# Patient Record
Sex: Female | Born: 1950 | Race: White | Hispanic: No | Marital: Married | Smoking: Never smoker
Health system: Southern US, Community
[De-identification: ages and names within clinical notes are randomized; demographics above are authoritative.]

## PROBLEM LIST (undated history)

## (undated) DIAGNOSIS — E049 Nontoxic goiter, unspecified: Secondary | ICD-10-CM

## (undated) DIAGNOSIS — E7800 Pure hypercholesterolemia, unspecified: Secondary | ICD-10-CM

## (undated) DIAGNOSIS — K429 Umbilical hernia without obstruction or gangrene: Secondary | ICD-10-CM

## (undated) DIAGNOSIS — B019 Varicella without complication: Secondary | ICD-10-CM

## (undated) DIAGNOSIS — E039 Hypothyroidism, unspecified: Secondary | ICD-10-CM

## (undated) DIAGNOSIS — Z1211 Encounter for screening for malignant neoplasm of colon: Secondary | ICD-10-CM

## (undated) DIAGNOSIS — E0590 Thyrotoxicosis, unspecified without thyrotoxic crisis or storm: Secondary | ICD-10-CM

## (undated) DIAGNOSIS — M810 Age-related osteoporosis without current pathological fracture: Secondary | ICD-10-CM

## (undated) DIAGNOSIS — Z9289 Personal history of other medical treatment: Secondary | ICD-10-CM

## (undated) HISTORY — DX: Age-related osteoporosis without current pathological fracture: M81.0

## (undated) HISTORY — DX: Nontoxic goiter, unspecified: E04.9

## (undated) HISTORY — DX: Pure hypercholesterolemia, unspecified: E78.00

## (undated) HISTORY — DX: Varicella without complication: B01.9

## (undated) HISTORY — DX: Personal history of other medical treatment: Z92.89

## (undated) HISTORY — DX: Thyrotoxicosis, unspecified without thyrotoxic crisis or storm: E05.90

## (undated) HISTORY — PX: HERNIA REPAIR: SHX51

## (undated) HISTORY — PX: THYROID SURGERY: SHX805

## (undated) HISTORY — DX: Umbilical hernia without obstruction or gangrene: K42.9

## (undated) HISTORY — DX: Hypothyroidism, unspecified: E03.9

## (undated) HISTORY — PX: MYOMECTOMY: SHX85

---
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
## 1987-02-06 HISTORY — PX: TUBAL LIGATION: SHX77

## 2008-08-05 DIAGNOSIS — E049 Nontoxic goiter, unspecified: Secondary | ICD-10-CM

## 2008-08-05 HISTORY — DX: Nontoxic goiter, unspecified: E04.9

## 2009-09-28 DIAGNOSIS — Z9289 Personal history of other medical treatment: Secondary | ICD-10-CM

## 2009-09-28 HISTORY — DX: Personal history of other medical treatment: Z92.89

## 2009-12-19 DIAGNOSIS — Z9289 Personal history of other medical treatment: Secondary | ICD-10-CM

## 2009-12-19 HISTORY — DX: Personal history of other medical treatment: Z92.89

## 2009-12-19 LAB — HM DEXA SCAN

## 2014-12-23 DIAGNOSIS — Z9289 Personal history of other medical treatment: Secondary | ICD-10-CM

## 2014-12-23 HISTORY — DX: Personal history of other medical treatment: Z92.89

## 2014-12-24 LAB — HM PAP SMEAR: HM Pap smear: NEGATIVE

## 2015-06-10 LAB — HM MAMMOGRAPHY

## 2016-04-04 ENCOUNTER — Other Ambulatory Visit: Payer: Self-pay | Admitting: Obstetrics and Gynecology

## 2016-04-04 DIAGNOSIS — Q501 Developmental ovarian cyst: Secondary | ICD-10-CM

## 2016-04-10 ENCOUNTER — Encounter: Payer: Self-pay | Admitting: Obstetrics and Gynecology

## 2016-04-10 ENCOUNTER — Ambulatory Visit (INDEPENDENT_AMBULATORY_CARE_PROVIDER_SITE_OTHER): Payer: BLUE CROSS/BLUE SHIELD | Admitting: Obstetrics and Gynecology

## 2016-04-10 ENCOUNTER — Ambulatory Visit (INDEPENDENT_AMBULATORY_CARE_PROVIDER_SITE_OTHER): Payer: BLUE CROSS/BLUE SHIELD

## 2016-04-10 VITALS — BP 132/68 | Ht 64.5 in | Wt 183.0 lb

## 2016-04-10 DIAGNOSIS — Q501 Developmental ovarian cyst: Secondary | ICD-10-CM

## 2016-04-10 DIAGNOSIS — Z01419 Encounter for gynecological examination (general) (routine) without abnormal findings: Secondary | ICD-10-CM | POA: Diagnosis not present

## 2016-04-10 DIAGNOSIS — Z1239 Encounter for other screening for malignant neoplasm of breast: Secondary | ICD-10-CM

## 2016-04-10 DIAGNOSIS — N839 Noninflammatory disorder of ovary, fallopian tube and broad ligament, unspecified: Secondary | ICD-10-CM | POA: Diagnosis not present

## 2016-04-10 DIAGNOSIS — N838 Other noninflammatory disorders of ovary, fallopian tube and broad ligament: Secondary | ICD-10-CM

## 2016-04-10 DIAGNOSIS — Z1211 Encounter for screening for malignant neoplasm of colon: Secondary | ICD-10-CM | POA: Diagnosis not present

## 2016-04-10 DIAGNOSIS — Z79890 Hormone replacement therapy (postmenopausal): Secondary | ICD-10-CM

## 2016-04-10 DIAGNOSIS — Z1231 Encounter for screening mammogram for malignant neoplasm of breast: Secondary | ICD-10-CM

## 2016-04-10 DIAGNOSIS — N951 Menopausal and female climacteric states: Secondary | ICD-10-CM

## 2016-04-10 LAB — HEMOCCULT GUIAC POC 1CARD (OFFICE): FECAL OCCULT BLD: NEGATIVE

## 2016-04-10 MED ORDER — PROGESTERONE MICRONIZED 100 MG PO CAPS: 100.0000 mg | ORAL_CAPSULE | Freq: Every day | ORAL | 3 refills | Status: DC

## 2016-04-10 MED ORDER — AMBULATORY NON FORMULARY MEDICATION: 1.0000 mL | Freq: Every day | 3 refills | Status: DC

## 2016-04-10 NOTE — Progress Notes (Signed)
HPI:      [name] is a 66 y.o. [phone] who LMP was No LMP recorded. Patient is postmenopausal., presents today for her annual examination.  Her menses are absent. She has vasomotor sx and treats with biest 5 mg 70/30 and progesterone 100 mg caps nightly. She was started on this by Carroll Kinds in the past who has since moved away. Her sx are mostly controlled. No side effects. No Vb.   She has a single partner, uses lubricants for vag dryness. Last Pap: 12/14/14 Results were: no abnormalities  /neg HPV DNA  Last mammogram: 06/10/15. Results were: normal--routine follow-up in 12 months There is no FH of breast or ovarian cancer. The patient does not do self-breast exams.  Colonoscopy: never, will consider  Tobacco use: The patient denies current or previous tobacco use. Alcohol use: none Exercise: moderately active  She does  get adequate calcium and Vitamin D in her diet.  She also had a f/u u/s today from 7/15 u/s which showed 3 cm RTO cyst. She had not done prior u/s f/u. She denies any RLQ pain, PMB sx. She has a hx of ovar cysts in her 58s.  Past Medical History:  Diagnosis Date  . 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 History of bone density study 12/19/2009   BIBC  . History of mammogram 09/28/2009   BIBC BIRAD 2; BIRADS 0 ON 05/12/15 RT BR MASS; COMP VIEWS AND U/S - SM CYST AT 9:00.  . History of Papanicolaou smear of cervix 12/23/2014   -/-  . Hypercholesteremia   . Hyperthyroidism   . Hypothyroidism (acquired)    AFTER THYROID ABLATION  . Osteoporosis    OF THE SPINE    Past Surgical History:  Procedure Laterality Date  . [place]  . MYOMECTOMY  1978-79   FIBROID REMOVED  . THYROID SURGERY     ABLATION  . TUBAL LIGATION  1989    Family History  Problem Relation Age of Onset  . Hypertension Mother   . Neurologic Disorder Mother   . Ulcerative colitis Mother   . Hypertension Father   . Heart attack Father   . Lymphoma Maternal Aunt    . Diabetes Maternal Grandmother   . Cancer - Cervical Paternal Grandmother   . Heart attack Paternal Grandfather      ROS:  Review of Systems  Constitutional: Negative for fever, malaise/fatigue and weight loss.  HENT: Negative for congestion, ear pain and sinus pain.   Respiratory: Negative for cough, shortness of breath and wheezing.   Cardiovascular: Negative for chest pain, orthopnea and leg swelling.  Gastrointestinal: Negative for constipation, diarrhea, nausea and vomiting.  Genitourinary: Negative for dysuria, frequency, hematuria and urgency.       Breast ROS: negative    Musculoskeletal: Negative for back pain, joint pain and myalgias.  Skin: Negative for itching and rash.  Neurological: Negative for dizziness, tingling, focal weakness and headaches.  Endo/Heme/Allergies: Negative for environmental allergies. Does not bruise/bleed easily.  Psychiatric/Behavioral: Negative for depression and suicidal ideas. The patient is not nervous/anxious and does not have insomnia.     Objective: BP 132/68   Ht 5' 4.5" (1.638 m)   Wt 183 lb (83 kg)   BMI 30.93 kg/m    Physical Exam  Constitutional: She is oriented to person, place, and time. She appears well-developed and well-nourished.  Genitourinary: Vagina normal and uterus normal. No erythema or tenderness in the vagina. No vaginal  discharge found. Right adnexum does not display mass and does not display tenderness. Left adnexum does not display mass and does not display tenderness. Cervix does not exhibit motion tenderness or polyp. Uterus is not enlarged or tender. Rectal exam shows guaiac negative stool.  Neck: Normal range of motion. No thyromegaly present.  Cardiovascular: Normal rate, regular rhythm and normal heart sounds.   No murmur heard. Pulmonary/Chest: Effort normal and breath sounds normal. Right breast exhibits no mass, no nipple discharge, no skin change and no tenderness. Left breast exhibits no mass, no  nipple discharge, no skin change and no tenderness.  Abdominal: Soft. There is no tenderness. There is no guarding.  Musculoskeletal: Normal range of motion.  Neurological: She is alert and oriented to person, place, and time. No cranial nerve deficit.  Psychiatric: She has a normal mood and affect. Her behavior is normal.  Vitals reviewed.   Results: GYN u/s:EM=85mm; no FF in CDS; LO not visualized due to bowel; RTO with either hydrosalpinx vs 6 cm RTO cyst (pt had poss 4 cm RTO cyst 7/15 u/s)  Hemoccult NEG   Assessment: Encounter for annual routine gynecological examination  Screening for colon cancer - Neg Hemoccult. Pt to consider GI ref for scr colonoscopy due to age. - Plan: POCT Occult Blood Stool, MM DIGITAL SCREENING BILATERAL  Vasomotor symptoms due to menopause - Plan: progesterone (PROMETRIUM) 100 MG capsule, AMBULATORY NON FORMULARY MEDICATION  Hormone replacement therapy - Rx RF Biest and progesterone eRxd. Will re-eval need for meds next yr.  Ovarian mass, right - Larger from 2015. Pt without sx. Rechk in 2-3 months. Will f/u with results/mgmt.  - Plan: US Pelvis Complete, US Pelvis Complete  Screening for breast cancer     Plan:            GYN counsel breast self exam, use and side effects of HRT     F/U  Return in about 3 months (around 07/11/2016) for GYN u/s RTO process.  Alicia B. Copland, PA-C 04/10/2016 9:10 PM

## 2016-04-12 LAB — HPV APTIMA: HPV Aptima: NEGATIVE

## 2016-04-23 ENCOUNTER — Telehealth: Payer: Self-pay | Admitting: Obstetrics and Gynecology

## 2016-04-23 NOTE — Telephone Encounter (Signed)
PT is calling today needing an refill on estrogen an 90 Day supply. Pt reports she was seen on 04/12/16. Pt uses [facility]. NI#[phone]

## 2016-04-23 NOTE — Telephone Encounter (Signed)
Tried to call pt to let her know rx for biest called in to [facility] but voicemail not set up. [facility].

## 2016-04-23 NOTE — Telephone Encounter (Signed)
Rx faxed at 3/18 annual. RN, pls call [facility] pharm with Rx RF info and notify pt that it was sent at annual and called in today.

## 2016-05-11 ENCOUNTER — Ambulatory Visit (INDEPENDENT_AMBULATORY_CARE_PROVIDER_SITE_OTHER): Payer: BLUE CROSS/BLUE SHIELD | Admitting: Primary Care

## 2016-05-11 ENCOUNTER — Encounter: Payer: Self-pay | Admitting: Primary Care

## 2016-05-11 ENCOUNTER — Encounter (INDEPENDENT_AMBULATORY_CARE_PROVIDER_SITE_OTHER): Payer: Self-pay

## 2016-05-11 VITALS — BP 136/84 | HR 90 | Temp 98.1°F | Ht 63.25 in | Wt 180.1 lb

## 2016-05-11 DIAGNOSIS — R232 Flushing: Secondary | ICD-10-CM | POA: Diagnosis not present

## 2016-05-11 DIAGNOSIS — R030 Elevated blood-pressure reading, without diagnosis of hypertension: Secondary | ICD-10-CM | POA: Diagnosis not present

## 2016-05-11 DIAGNOSIS — E039 Hypothyroidism, unspecified: Secondary | ICD-10-CM | POA: Diagnosis not present

## 2016-05-11 NOTE — Assessment & Plan Note (Signed)
Stable based off of labs she brought into the clinic, will scan into chart. Discussed that I will manage as long as she is stable, but will need to see specialist if she becomes unstable.

## 2016-05-11 NOTE — Assessment & Plan Note (Signed)
Stable, following with GYN.  Managed on compounded estrogen.

## 2016-05-11 NOTE — Progress Notes (Signed)
Subjective:    Patient ID: Kathryn Fields, female    DOB: Jul 07, 1950, 66 y.o.   MRN: [id number]  HPI  Kathryn Fields is a 66 year old female who presents today to establish care and discuss the problems mentioned below. Will obtain old records.  1) Hot Flashes/Osteoporosis: Currently managed on compounded estrogen and Prometrium capsules. Also managed on Vitamin D 5000 units. She is currently following with GYN annually. Overall she feels well managed.   2) Hypothyroidism: Initially with hyperthyroidism, underwent radioactive iodine several years ago and has been hypothyroid since. Currently managed on Armour Thyroid 90 mg and liothyronine 20 mcg (compounded). Her last levels were stable in July 2017. She does occasionally feel jittery but will reduce the dose of her Armour Thyroid with improvement in symptoms.  3) Tinnitus: Located to the left ear that has been present intermittently for years. Her tinnitus has overall improved with vitamin B 12.   4) Elevated Blood Pressure: Initial BP in the office today of 146/90,. She checks her BP at home and gets readings of 130's/80's on average. She has family history of hypertension in both parents. She denies chest pain, shortness of breath, dizziness.   Review of Systems  Constitutional: Negative for fatigue.  HENT: Positive for tinnitus.   Eyes: Negative for visual disturbance.  Respiratory: Negative for shortness of breath.   Cardiovascular: Negative for chest pain and palpitations.  Neurological: Negative for dizziness and headaches.       Past Medical History:  Diagnosis Date  . Chickenpox   . 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 History of bone density study 12/19/2009   BIBC  . History of mammogram 09/28/2009   BIBC BIRAD 2; BIRADS 0 ON 05/12/15 RT BR MASS; COMP VIEWS AND U/S - SM CYST AT 9:00.  . History of Papanicolaou smear of cervix 12/23/2014   -/-  . Hypercholesteremia   . Hyperthyroidism   . Hypothyroidism (acquired)    AFTER THYROID ABLATION  . Osteoporosis    OF THE SPINE  . Periumbilical hernia      Social History   Social History  . Marital status: Married    Spouse name: N/A  . Number of children: N/A  . Years of education: 20   Occupational History  . RETIRED    Social History Main Topics  . Smoking status: Never Smoker  . Smokeless tobacco: Never Used  . Alcohol use No  . Drug use: No  . Sexual activity: Yes    Birth control/ protection: Post-menopausal   Other Topics Concern  . Not on file   Social History Narrative   Married.   3 children.   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   Enjoys bowling, traveling, reading.     Past Surgical History:  Procedure Laterality Date  . [place]  . MYOMECTOMY  1978-79   FIBROID REMOVED  . THYROID SURGERY     ABLATION  . TUBAL LIGATION  1989    Family History  Problem Relation Age of Onset  . Hypertension Mother   . Neurologic Disorder Mother   . Ulcerative colitis Mother   . Hypertension Father   . Heart attack Father   . Lymphoma Maternal Aunt   . Diabetes Maternal Grandmother   . Cancer - Cervical Paternal Grandmother   . Heart attack Paternal Grandfather     Allergies  Allergen Reactions  . Iodinated Diagnostic Agents     Iv dye,iodine containing contrast  media    Current Outpatient Prescriptions on File Prior to Visit  Medication Sig Dispense Refill  . AMBULATORY NON FORMULARY MEDICATION Take 12.5 mg by mouth daily   Lodorol - Iodine/Potassium Supplement    . AMBULATORY NON FORMULARY MEDICATION Place 1 mL onto the skin daily. Biest 5 mg 70/30 30 mL 3  . Cholecalciferol (VITAMIN D3) 5000 units TABS Take 5,000 Units by mouth daily.    . Menaquinone-7 (VITAMIN K2) 40 MCG TABS Take by mouth.    . progesterone (PROMETRIUM) 100 MG capsule Take 1 capsule (100 mg total) by mouth daily. 90 capsule 3  . thyroid (ARMOUR) 90 MG tablet Take 90 mg by mouth daily.    . vitamin B-12 (CYANOCOBALAMIN) 1000 MCG  tablet Take 1,000 mcg by mouth daily.    . vitamin E 400 UNIT capsule Take 400 Units by mouth daily.     No current facility-administered medications on file prior to visit.     BP 136/84   Pulse 90   Temp 98.1 F (36.7 C) (Oral)   Ht 5' 3.25" (1.607 m)   Wt 180 lb 1.9 oz (81.7 kg)   SpO2 98%   BMI 31.66 kg/m    Objective:   Physical Exam  Constitutional: She appears well-nourished.  Neck: Neck supple. No thyromegaly present.  Cardiovascular: Normal rate and regular rhythm.   Pulmonary/Chest: Effort normal and breath sounds normal.  Skin: Skin is warm and dry.  Psychiatric: She has a normal mood and affect.          Assessment & Plan:

## 2016-05-11 NOTE — Progress Notes (Signed)
Pre visit review using our clinic review tool, if applicable. No additional management support is needed unless otherwise documented below in the visit note. 

## 2016-05-11 NOTE — Assessment & Plan Note (Signed)
Slightly above goal upon initial check, improved with recheck. Will continue to monitor.

## 2016-05-11 NOTE — Patient Instructions (Signed)
Schedule a lab only appointment up front to recheck your thyroid function.  It was a pleasure to meet you today! Please don't hesitate to call me with any questions. Welcome to [facility]!

## 2016-05-15 ENCOUNTER — Encounter (INDEPENDENT_AMBULATORY_CARE_PROVIDER_SITE_OTHER): Payer: Self-pay

## 2016-05-15 ENCOUNTER — Other Ambulatory Visit (INDEPENDENT_AMBULATORY_CARE_PROVIDER_SITE_OTHER): Payer: BLUE CROSS/BLUE SHIELD

## 2016-05-15 DIAGNOSIS — E039 Hypothyroidism, unspecified: Secondary | ICD-10-CM | POA: Diagnosis not present

## 2016-05-15 LAB — T3, FREE: T3, Free: 4 pg/mL (ref 2.3–4.2)

## 2016-05-15 LAB — T4, FREE: Free T4: 1.2 ng/dL (ref 0.8–1.8)

## 2016-05-15 LAB — TSH: TSH: 0.01 m[IU]/L — AB

## 2016-05-17 LAB — T3, REVERSE: T3 REVERSE: 14 ng/dL (ref 8–25)

## 2016-07-12 ENCOUNTER — Telehealth: Payer: Self-pay | Admitting: Obstetrics and Gynecology

## 2016-07-12 ENCOUNTER — Ambulatory Visit (INDEPENDENT_AMBULATORY_CARE_PROVIDER_SITE_OTHER): Payer: BLUE CROSS/BLUE SHIELD

## 2016-07-12 DIAGNOSIS — N838 Other noninflammatory disorders of ovary, fallopian tube and broad ligament: Secondary | ICD-10-CM

## 2016-07-12 DIAGNOSIS — N839 Noninflammatory disorder of ovary, fallopian tube and broad ligament, unspecified: Secondary | ICD-10-CM

## 2016-07-12 DIAGNOSIS — N7011 Chronic salpingitis: Secondary | ICD-10-CM

## 2016-07-12 NOTE — Telephone Encounter (Signed)
LM for pt with u/s results. RT hydrosalpinx is 5 cm, down from 6 cm. Message sent to [facility] for dispo. Will f/u with pt after discussing with him.

## 2016-07-16 ENCOUNTER — Other Ambulatory Visit: Payer: Self-pay | Admitting: Obstetrics and Gynecology

## 2016-07-16 DIAGNOSIS — N7011 Chronic salpingitis: Secondary | ICD-10-CM

## 2016-07-16 NOTE — Telephone Encounter (Signed)
See orders note.

## 2016-07-16 NOTE — Progress Notes (Signed)
Review of ULTRASOUND.    I have personally reviewed images and report of recent ultrasound done at [facility].    Plan of management to be discussed with patient.  Barnett Applebaum, MD, [facility], [facility] 07/16/2016  11:28 AM

## 2016-07-16 NOTE — Telephone Encounter (Signed)
Pt aware of u/s results. Will check ca-125 due to hydrosalpinx. If neg, will cont to follow with u/s. I suggested 8-12 wk f/u, pt not sure why she needs repeat u/s since she is not having sx. Decided to check labs first and then discuss her timeline preference for u/s f/u.

## 2016-07-16 NOTE — Telephone Encounter (Signed)
Pt calling to remind [facility] to call c u/s results.  [phone]

## 2016-10-16 ENCOUNTER — Other Ambulatory Visit: Payer: Self-pay

## 2016-10-16 DIAGNOSIS — E039 Hypothyroidism, unspecified: Secondary | ICD-10-CM

## 2016-10-16 MED ORDER — THYROID 90 MG PO TABS: 90.0000 mg | ORAL_TABLET | Freq: Every day | ORAL | 1 refills | Status: DC

## 2016-10-16 NOTE — Telephone Encounter (Signed)
Please call in liothyronine 20 mcg to [facility]. Take 1 tablet by mouth daily. #90, 1 refill

## 2016-10-16 NOTE — Telephone Encounter (Signed)
Pt left v/m requesting refill for armour thyroid [facility] and [facility] T 3   20 mcg to [facility]. Pt request cb when done. Pt established care 05/11/16 and labs done 05/15/16. Allie Bossier NP has not prescribed before.Please advise.

## 2016-10-16 NOTE — Telephone Encounter (Signed)
Called in medication to the pharmacy as instructed. 

## 2016-10-16 NOTE — Telephone Encounter (Signed)
Notified patient that Rx has been called in to [facility]

## 2017-03-01 LAB — CA 125: CANCER ANTIGEN (CA) 125: 8.1 U/mL (ref 0.0–38.1)

## 2017-03-04 ENCOUNTER — Other Ambulatory Visit: Payer: Self-pay | Admitting: Obstetrics and Gynecology

## 2017-03-04 ENCOUNTER — Telehealth: Payer: Self-pay | Admitting: Obstetrics and Gynecology

## 2017-03-04 DIAGNOSIS — N951 Menopausal and female climacteric states: Secondary | ICD-10-CM

## 2017-03-04 MED ORDER — PROGESTERONE MICRONIZED 100 MG PO CAPS: 100.0000 mg | ORAL_CAPSULE | Freq: Every day | ORAL | 0 refills | Status: DC

## 2017-03-04 MED ORDER — BIEST/PROGESTERONE TD CREA: 1.0000 mL | TOPICAL_CREAM | Freq: Every day | TRANSDERMAL | 1 refills | Status: DC

## 2017-03-04 NOTE — Telephone Encounter (Signed)
Pt states progesterone needs to go to [facility].  Their # is [phone].

## 2017-03-04 NOTE — Telephone Encounter (Signed)
Pt aware of normal ca-125. Had hydrosalpinx 6/18 and was due for labs then. Pt just had done. Was suggested to f/u with u/s in 8-12 wks at that time, but pt was asymptomatic and wanted to wait. Has f/u u/s and annual sched for 04/11/17. Will f/u with mgmt then. Rx RF ERT and prog till 3/19 annual.

## 2017-04-04 ENCOUNTER — Other Ambulatory Visit: Payer: Self-pay | Admitting: Primary Care

## 2017-04-04 DIAGNOSIS — E039 Hypothyroidism, unspecified: Secondary | ICD-10-CM

## 2017-04-11 ENCOUNTER — Ambulatory Visit (INDEPENDENT_AMBULATORY_CARE_PROVIDER_SITE_OTHER): Payer: Medicare Other | Admitting: Obstetrics and Gynecology

## 2017-04-11 ENCOUNTER — Ambulatory Visit (INDEPENDENT_AMBULATORY_CARE_PROVIDER_SITE_OTHER): Payer: Medicare Other

## 2017-04-11 ENCOUNTER — Encounter: Payer: Self-pay | Admitting: Obstetrics and Gynecology

## 2017-04-11 VITALS — BP 140/80 | HR 92 | Ht 64.0 in | Wt 177.0 lb

## 2017-04-11 DIAGNOSIS — Z01419 Encounter for gynecological examination (general) (routine) without abnormal findings: Secondary | ICD-10-CM

## 2017-04-11 DIAGNOSIS — Z1231 Encounter for screening mammogram for malignant neoplasm of breast: Secondary | ICD-10-CM | POA: Diagnosis not present

## 2017-04-11 DIAGNOSIS — Z79890 Hormone replacement therapy (postmenopausal): Secondary | ICD-10-CM | POA: Diagnosis not present

## 2017-04-11 DIAGNOSIS — N7011 Chronic salpingitis: Secondary | ICD-10-CM | POA: Diagnosis not present

## 2017-04-11 DIAGNOSIS — Z13820 Encounter for screening for osteoporosis: Secondary | ICD-10-CM | POA: Diagnosis not present

## 2017-04-11 DIAGNOSIS — Z1211 Encounter for screening for malignant neoplasm of colon: Secondary | ICD-10-CM | POA: Diagnosis not present

## 2017-04-11 DIAGNOSIS — N951 Menopausal and female climacteric states: Secondary | ICD-10-CM | POA: Diagnosis not present

## 2017-04-11 DIAGNOSIS — N839 Noninflammatory disorder of ovary, fallopian tube and broad ligament, unspecified: Secondary | ICD-10-CM

## 2017-04-11 DIAGNOSIS — Z1239 Encounter for other screening for malignant neoplasm of breast: Secondary | ICD-10-CM

## 2017-04-11 LAB — HEMOCCULT GUIAC POC 1CARD (OFFICE): FECAL OCCULT BLD: NEGATIVE

## 2017-04-11 MED ORDER — PROGESTERONE MICRONIZED 100 MG PO CAPS: 100.0000 mg | ORAL_CAPSULE | Freq: Every day | ORAL | 3 refills | Status: DC

## 2017-04-11 MED ORDER — AMBULATORY NON FORMULARY MEDICATION
12 refills | Status: DC
Start: 2017-04-11 — End: 2018-08-20

## 2017-04-11 NOTE — Patient Instructions (Signed)
I value your feedback and entrusting us with your care. If you get a [facility] patient survey, I would appreciate you taking the time to let us know about your experience today. Thank you! 

## 2017-04-11 NOTE — Progress Notes (Signed)
PCP: Pleas Koch, NP   Chief Complaint  Patient presents with  . Gynecologic Exam    HPI:      Kathryn Fields is a 67 y.o. [phone] who LMP was No LMP recorded. Patient is postmenopausal., presents today for her annual examination.  Her menses are absent due to menopause. She does not have intermenstrual bleeding.  She does have vasomotor sx and treats with biest 5 mg 70/30 and progesterone 100 mg caps nightly. She was started on this by Carroll Kinds in the past who has since moved away. Her sx are mostly controlled. No side effects.   Sex activity: single partner, contraception - post menopausal status. She does have vaginal dryness, uses lubricants with relief.  Last Pap: December 14, 2014  Results were: no abnormalities /neg HPV DNA.  Hx of STDs: none  Last mammogram: Jun 10, 2015  Results were: normal--routine follow-up in 12 months There is no FH of breast cancer. There is no FH of ovarian cancer. The patient does not do self-breast exams.  Colonoscopy: never; considering it.  Tobacco use: The patient denies current or previous tobacco use. Alcohol use: none Exercise: moderately active  She does get adequate calcium and Vitamin D in her diet.  Labs with PCP.  She is being followed for RT hydrosalpinx noted on u/s last yr. She had neg ca-125 1/19. No pain/sx. F/u u/s done today.   Past Medical History:  Diagnosis Date  . Chickenpox   . 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 History of bone density study 12/19/2009   BIBC  . History of mammogram 09/28/2009   BIBC BIRAD 2; BIRADS 0 ON 05/12/15 RT BR MASS; COMP VIEWS AND U/S - SM CYST AT 9:00.  . History of Papanicolaou smear of cervix 12/23/2014   -/-  . Hypercholesteremia   . Hyperthyroidism   . Hypothyroidism (acquired)    AFTER THYROID ABLATION  . Osteoporosis    OF THE SPINE  . Periumbilical hernia     Past Surgical History:  Procedure Laterality Date  . [place]  . MYOMECTOMY   1978-79   FIBROID REMOVED  . THYROID SURGERY     ABLATION  . TUBAL LIGATION  1989    Family History  Problem Relation Age of Onset  . Hypertension Mother   . Neurologic Disorder Mother   . Ulcerative colitis Mother   . Hypertension Father   . Heart attack Father   . Lymphoma Maternal Aunt   . Diabetes Maternal Grandmother   . Cancer - Cervical Paternal Grandmother   . Heart attack Paternal Grandfather   . Uterine cancer Maternal Aunt        ?    Social History   Socioeconomic History  . Marital status: Married    Spouse name: Not on file  . Number of children: Not on file  . Years of education: 49  . Highest education level: Not on file  Social Needs  . Financial resource strain: Not on file  . Food insecurity - worry: Not on file  . Food insecurity - inability: Not on file  . Transportation needs - medical: Not on file  . Transportation needs - non-medical: Not on file  Occupational History  . Occupation: RETIRED  Tobacco Use  . Smoking status: Never Smoker  . Smokeless tobacco: Never Used  Substance and Sexual Activity  . Alcohol use: No  . Drug use: No  . Sexual activity:  Yes    Birth control/protection: Post-menopausal  Other Topics Concern  . Not on file  Social History Narrative   Married.   3 children.   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   Enjoys bowling, traveling, reading.     Current Meds  Medication Sig  . AMBULATORY NON FORMULARY MEDICATION Take 12.5 mg by mouth daily   Lodorol - Iodine/Potassium Supplement  . AMBULATORY NON FORMULARY MEDICATION Biest 5 mg 70/30; place 1 ml on skin daily  . Cholecalciferol (VITAMIN D3) 5000 units TABS Take 5,000 Units by mouth daily.  . Estradiol-Estriol-Progesterone (BIEST/PROGESTERONE) CREA Apply 1 mL topically daily. Use 1 ml Tropically Daily As Directed  BIEST 5 mg 70/30 VANICRM  . LIOTHYRONINE SODIUM PO Take 20 mcg by mouth daily.  Marland Kitchen MAGNESIUM CHLORIDE PO Take 800 mg by mouth as needed.  .  Menaquinone-7 (VITAMIN K2) 40 MCG TABS Take by mouth.  . progesterone (PROMETRIUM) 100 MG capsule Take 1 capsule (100 mg total) by mouth daily.  Marland Kitchen thyroid (ARMOUR THYROID) 90 MG tablet Take 1 tablet (90 mg total) by mouth daily. NEED APPOINTMENT FOR ANY MORE REFILLS  . vitamin B-12 (CYANOCOBALAMIN) 1000 MCG tablet Take 1,000 mcg by mouth daily.  . vitamin E 400 UNIT capsule Take 400 Units by mouth daily.  . [DISCONTINUED] AMBULATORY NON FORMULARY MEDICATION Place 1 mL onto the skin daily. Biest 5 mg 70/30  . [DISCONTINUED] progesterone (PROMETRIUM) 100 MG capsule Take 1 capsule (100 mg total) by mouth daily.  . [DISCONTINUED] progesterone (PROMETRIUM) 100 MG capsule TAKE 1 CAPSULE DAILY      ROS:  Review of Systems  Constitutional: Negative for fatigue, fever and unexpected weight change.  Respiratory: Negative for cough, shortness of breath and wheezing.   Cardiovascular: Negative for chest pain, palpitations and leg swelling.  Gastrointestinal: Negative for blood in stool, constipation, diarrhea, nausea and vomiting.  Endocrine: Negative for cold intolerance, heat intolerance and polyuria.  Genitourinary: Negative for dyspareunia, dysuria, flank pain, frequency, genital sores, hematuria, menstrual problem, pelvic pain, urgency, vaginal bleeding, vaginal discharge and vaginal pain.  Musculoskeletal: Negative for back pain, joint swelling and myalgias.  Skin: Negative for rash.  Neurological: Negative for dizziness, syncope, light-headedness, numbness and headaches.  Hematological: Negative for adenopathy.  Psychiatric/Behavioral: Negative for agitation, confusion, sleep disturbance and suicidal ideas. The patient is not nervous/anxious.      Objective: BP 140/80   Pulse 92   Ht 5\' 4"  (1.626 m)   Wt 177 lb (80.3 kg)   BMI 30.38 kg/m    Physical Exam  Constitutional: She is oriented to person, place, and time. She appears well-developed and well-nourished.  Genitourinary:  Rectum normal, vagina normal and uterus normal. There is no rash or tenderness on the right labia. There is no rash or tenderness on the left labia. No erythema or tenderness in the vagina. No vaginal discharge found. Right adnexum does not display mass and does not display tenderness. Left adnexum does not display mass and does not display tenderness. Cervix does not exhibit motion tenderness or polyp. Uterus is not enlarged or tender. Rectal exam shows no fissure and guaiac negative stool.  Neck: Normal range of motion. No thyromegaly present.  Cardiovascular: Normal rate, regular rhythm and normal heart sounds.  No murmur heard. Pulmonary/Chest: Effort normal and breath sounds normal. Right breast exhibits no mass, no nipple discharge, no skin change and no tenderness. Left breast exhibits no mass, no nipple discharge, no skin change and no tenderness.  Abdominal: Soft.  There is no tenderness. There is no guarding.  Musculoskeletal: Normal range of motion.  Neurological: She is alert and oriented to person, place, and time. No cranial nerve deficit.  Psychiatric: She has a normal mood and affect. Her behavior is normal.  Vitals reviewed.   Results: No results found for this or any previous visit (from the past 24 hour(s)).  ULTRASOUND REPORT  Location: [facility]  Date of Service: 04/11/2017    Indications:ov mass Findings:  The uterus is anteverted and measures 5.51 x 3.36 x 4.10cm. Echo texture is homogenous without evidence of focal masses.  The Endometrium measures 4.38 mm.  Right Ovary is not identified, but the fallopian tube remains dilated. Left Ovary  is not identified. Survey of the adnexa demonstrates no adnexal masses. There is no free fluid in the cul de sac.  Impression: 1. Dilate right fallopian tube, otherwise normal. 3.5 x 3.7 x 5.48 cm (was 4.9 x 5.3 cm 6/18)  Recommendations: 1.Clinical correlation with the patient's History and Physical  Exam.   Edwena Bunde, RDMS, RVT   Assessment/Plan:  Encounter for annual routine gynecological examination  Screening for breast cancer - Pt to have at [facility] with DEXA - Plan: MM DIGITAL SCREENING BILATERAL  Screening for colon cancer - Neg FOBT. Recommended colonoscopy. Pt to consider. Also can do cologard prn. Pt to f/u prn. - Plan: POCT Occult Blood Stool  Screening for osteoporosis - DEXA due at [facility]. Cont ca/Vit D/exercise. - Plan: DG Bone Density  Hydrosalpinx - RT, stable. Will discuss disposition with Dr. Kenton Kingfisher and f/u wiht pt. No pain/sx. Neg ca-125 1/19  Vasomotor symptoms due to menopause - Improved with HRT. Rx RF Biest and prog. Re-eval next yr. - Plan: progesterone (PROMETRIUM) 100 MG capsule, AMBULATORY NON FORMULARY MEDICATION  Hormone replacement therapy (HRT) - Plan: progesterone (PROMETRIUM) 100 MG capsule, AMBULATORY NON FORMULARY MEDICATION   Meds ordered this encounter  Medications  . progesterone (PROMETRIUM) 100 MG capsule    Sig: Take 1 capsule (100 mg total) by mouth daily.    Dispense:  90 capsule    Refill:  3    Order Specific Question:   Supervising Provider    Answer:   Gae Dry [id number]  . AMBULATORY NON FORMULARY MEDICATION    Sig: Biest 5 mg 70/30; place 1 ml on skin daily    Dispense:  30 mL    Refill:  12    Dispense 3-30 ml tubes    Order Specific Question:   Supervising Provider    Answer:   Gae Dry [[id number]]    GYN counsel breast self exam, mammography screening, menopause, osteoporosis, adequate intake of calcium and vitamin D, diet and exercise    F/U  Return in about 1 year (around 04/12/2018).  Alicia B. Copland, PA-C 04/15/2017 5:13 PM

## 2017-04-15 ENCOUNTER — Telehealth: Payer: Self-pay | Admitting: Obstetrics and Gynecology

## 2017-04-15 DIAGNOSIS — N7011 Chronic salpingitis: Secondary | ICD-10-CM

## 2017-04-15 NOTE — Telephone Encounter (Signed)
Pt aware of GYN u/s results and stable RT hydrosalpinx. First had RTO cyst on 7/13 u/s, then had either RTO cyst vs hydrosalpinx 3/18 u/s. 6/18 u/s showed RT hydrosalpinx only. Pt waited to have f/u u/s till annual 3/19. Pt is s/p TL. No pain. Doesn't want surg. Had neg ca-125 1/19. Pt aware of Dr. Kenton Kingfisher' recommendations. Prefers u/s again. Will f/u 7/19 for repeat u/s. ABC to call pt.    Gae Dry, MD  Gavina Dildine, Deirdre Evener, PA-C        Concerning at her age.  No pain? Probably no problem, but only way to be sure is to remove it.  CT to look for LAN, but then you are already behind.  If she is adamant against surgery, then would follow as you have

## 2017-04-17 ENCOUNTER — Telehealth: Payer: Self-pay | Admitting: Obstetrics and Gynecology

## 2017-04-17 NOTE — Telephone Encounter (Signed)
Lvm for patient to call back to be schedule for gyn u/s and follow you with ABC. Start date for u/s is 07/16/17

## 2017-05-08 ENCOUNTER — Telehealth: Payer: Self-pay | Admitting: Primary Care

## 2017-05-08 NOTE — Telephone Encounter (Signed)
Copied from [place]. Topic: Quick Communication - Rx Refill/Question >> May 08, 2017  4:09 PM Tye Maryland wrote: Medication: LIOTHYRONINE SODIUM PO [[id number]] Has the patient contacted their pharmacy? Yes.   (Agent: If no, request that the patient contact the pharmacy for the refill.) Preferred Pharmacy (with phone number or street name): [facility] Agent: Please be advised that RX refills may take up to 3 business days. We ask that you follow-up with your pharmacy.

## 2017-05-08 NOTE — Telephone Encounter (Signed)
Patient is requesting a refill of a historical medication: Liothyronine Sodium 20 mcg  LOV: 05/11/16  PCP: [place]: verified

## 2017-05-09 NOTE — Telephone Encounter (Signed)
Do not see where Gentry Fitz NP has filled the Liothyronine. Pt established care 05/11/16; do not see any appts since then and no future appts scheduled.Please advise. [facility].

## 2017-05-09 NOTE — Telephone Encounter (Signed)
mis routed Rx request- apologize for the mistake

## 2017-05-09 NOTE — Telephone Encounter (Signed)
Patient needs an office visit for follow up for any further refills, please schedule. I can't send this electronically, will you call into the pharmacy? Thanks.

## 2017-05-09 NOTE — Telephone Encounter (Signed)
Kathryn Fields is not a practicing provider at [employer] [facility] [employer]

## 2017-05-10 NOTE — Telephone Encounter (Signed)
Faxed Rx to [facility]. Patient had appt on 05/20/2017

## 2017-05-20 ENCOUNTER — Ambulatory Visit: Payer: Medicare Other | Admitting: Primary Care

## 2017-05-20 ENCOUNTER — Encounter: Payer: Self-pay | Admitting: Primary Care

## 2017-05-20 VITALS — BP 136/78 | HR 91 | Temp 98.1°F | Ht 64.0 in | Wt 176.5 lb

## 2017-05-20 DIAGNOSIS — R030 Elevated blood-pressure reading, without diagnosis of hypertension: Secondary | ICD-10-CM

## 2017-05-20 DIAGNOSIS — R232 Flushing: Secondary | ICD-10-CM | POA: Diagnosis not present

## 2017-05-20 DIAGNOSIS — E039 Hypothyroidism, unspecified: Secondary | ICD-10-CM

## 2017-05-20 LAB — COMPREHENSIVE METABOLIC PANEL
ALBUMIN: 3.9 g/dL (ref 3.5–5.2)
ALK PHOS: 64 U/L (ref 39–117)
ALT: 19 U/L (ref 0–35)
AST: 19 U/L (ref 0–37)
BILIRUBIN TOTAL: 0.5 mg/dL (ref 0.2–1.2)
BUN: 14 mg/dL (ref 6–23)
CO2: 28 mEq/L (ref 19–32)
CREATININE: 0.73 mg/dL (ref 0.40–1.20)
Calcium: 9.5 mg/dL (ref 8.4–10.5)
Chloride: 104 mEq/L (ref 96–112)
GFR: 84.96 mL/min (ref >60.00)
GLUCOSE: 93 mg/dL (ref 70–99)
POTASSIUM: 4 meq/L (ref 3.5–5.1)
SODIUM: 140 meq/L (ref 135–145)
TOTAL PROTEIN: 6.6 g/dL (ref 6.0–8.3)

## 2017-05-20 LAB — T4, FREE: Free T4: 0.7 ng/dL (ref 0.60–1.60)

## 2017-05-20 LAB — T3, FREE: T3 FREE: 4.9 pg/mL — AB (ref 2.3–4.2)

## 2017-05-20 LAB — TSH: TSH: <0.01 u[IU]/mL — ABNORMAL LOW (ref 0.35–4.50)

## 2017-05-20 NOTE — Assessment & Plan Note (Signed)
Following with GYN, feels well managed on regimen.

## 2017-05-20 NOTE — Patient Instructions (Signed)
Stop by the lab prior to leaving today. I will notify you of your results once received.   Monitor your blood pressure periodically and notify me if you see numbers at or above 135/90 consistently.   It was a pleasure to see you today!

## 2017-05-20 NOTE — Assessment & Plan Note (Signed)
Due for repeat thyroid testing today.. Continue current regimen for now. Will send refills once labs received.

## 2017-05-20 NOTE — Progress Notes (Signed)
Subjective:    Patient ID: Kathryn Fields, female    DOB: Jun 07, 1950, 67 y.o.   MRN: [id number]  HPI  Kathryn Fields is a 67 year old female who presents today for medication refills and follow up.  1) Hypothyroidism: Currently managed on Armour Thyroid 90 mg and liothyronine 20 mcg compounded for which she's taken for years. She doesn't feel well on levothyroxine. She is due for repeat thyroid tesitng today. History of hyperthyroidism and underwent radioactive iodine treatment.    2) Hot Flashes/Osteoporosis: Currently managed on compounded estrogen and Prometrium capsules. Also managed on Vitamin D 5000 units once daily. She has a bone density scan and mammogram scheduled for later this month. Following with GYN.  3) Elevated Blood Pressure Reading: She does not regularly check her blood pressure at home. She denies dizziness, chest pain, headaches.   BP Readings from Last 3 Encounters:  05/20/17 136/78  04/11/17 140/80  05/11/16 136/84     Review of Systems  Respiratory: Negative for shortness of breath.   Cardiovascular: Negative for chest pain.  Neurological: Negative for dizziness and headaches.       Past Medical History:  Diagnosis Date  . Chickenpox   . 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 History of bone density study 12/19/2009   BIBC  . History of mammogram 09/28/2009   BIBC BIRAD 2; BIRADS 0 ON 05/12/15 RT BR MASS; COMP VIEWS AND U/S - SM CYST AT 9:00.  . History of Papanicolaou smear of cervix 12/23/2014   -/-  . Hypercholesteremia   . Hyperthyroidism   . Hypothyroidism (acquired)    AFTER THYROID ABLATION  . Osteoporosis    OF THE SPINE  . Periumbilical hernia      Social History   Socioeconomic History  . Marital status: Married    Spouse name: Not on file  . Number of children: Not on file  . Years of education: 43  . Highest education level: Not on file  Occupational History  . Occupation: RETIRED  Social Needs  . Financial resource strain:  Not on file  . Food insecurity:    Worry: Not on file    Inability: Not on file  . Transportation needs:    Medical: Not on file    Non-medical: Not on file  Tobacco Use  . Smoking status: Never Smoker  . Smokeless tobacco: Never Used  Substance and Sexual Activity  . Alcohol use: No  . Drug use: No  . Sexual activity: Yes    Birth control/protection: Post-menopausal  Lifestyle  . Physical activity:    Days per week: Not on file    Minutes per session: Not on file  . Stress: Not on file  Relationships  . Social connections:    Talks on phone: Not on file    Gets together: Not on file    Attends religious service: Not on file    Active member of club or organization: Not on file    Attends meetings of clubs or organizations: Not on file    Relationship status: Not on file  . Intimate partner violence:    Fear of current or ex partner: Not on file    Emotionally abused: Not on file    Physically abused: Not on file    Forced sexual activity: Not on file  Other Topics Concern  . Not on file  Social History Narrative   Married.   3 children.   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   Enjoys Environmental consultant, traveling, reading.     Past Surgical History:  Procedure Laterality Date  . [place]  . MYOMECTOMY  1978-79   FIBROID REMOVED  . THYROID SURGERY     ABLATION  . TUBAL LIGATION  1989    Family History  Problem Relation Age of Onset  . Hypertension Mother   . Neurologic Disorder Mother   . Ulcerative colitis Mother   . Hypertension Father   . Heart attack Father   . Lymphoma Maternal Aunt   . Diabetes Maternal Grandmother   . Cancer - Cervical Paternal Grandmother   . Heart attack Paternal Grandfather   . Uterine cancer Maternal Aunt        ?    Allergies  Allergen Reactions  . Iodinated Diagnostic Agents     Iv dye,iodine containing contrast media    Current Outpatient Medications on File Prior to Visit  Medication Sig Dispense Refill   . AMBULATORY NON FORMULARY MEDICATION Take 12.5 mg by mouth daily   Lodorol - Iodine/Potassium Supplement    . AMBULATORY NON FORMULARY MEDICATION Biest 5 mg 70/30; place 1 ml on skin daily 30 mL 12  . Cholecalciferol (VITAMIN D3) LIQD Take 5,000 Units by mouth daily.    . Estradiol-Estriol-Progesterone (BIEST/PROGESTERONE) CREA Apply 1 mL topically daily. Use 1 ml Tropically Daily As Directed  BIEST 5 mg 70/30 VANICRM 30 g 1  . LIOTHYRONINE SODIUM PO Take 20 mcg by mouth daily.    Marland Kitchen MAGNESIUM CHLORIDE PO Take 800 mg by mouth as needed.    Marland Kitchen MENAQUINONE-7 PO Take 90 mcg by mouth daily.    . Methylcobalamin 1000 MCG TBDP Take 1,000 mcg by mouth daily.    . progesterone (PROMETRIUM) 100 MG capsule Take 1 capsule (100 mg total) by mouth daily. 90 capsule 3  . thyroid (ARMOUR THYROID) 90 MG tablet Take 1 tablet (90 mg total) by mouth daily. NEED APPOINTMENT FOR ANY MORE REFILLS 90 tablet 0  . vitamin E 400 UNIT capsule Take 400 Units by mouth daily.     No current facility-administered medications on file prior to visit.     BP 136/78   Pulse 91   Temp 98.1 F (36.7 C) (Oral)   Ht 5\' 4"  (1.626 m)   Wt 176 lb 8 oz (80.1 kg)   SpO2 98%   BMI 30.30 kg/m    Objective:   Physical Exam  Constitutional: She appears well-nourished.  Neck: Neck supple. No thyromegaly present.  Cardiovascular: Normal rate and regular rhythm.  Pulmonary/Chest: Effort normal and breath sounds normal.  Skin: Skin is warm and dry.          Assessment & Plan:

## 2017-05-20 NOTE — Assessment & Plan Note (Addendum)
Stable in the office today, will have her start monitoring periodically and report consistent elevated readings.

## 2017-05-21 ENCOUNTER — Other Ambulatory Visit: Payer: Self-pay | Admitting: Primary Care

## 2017-05-23 LAB — T3, REVERSE: T3 REVERSE: 18 ng/dL (ref 8–25)

## 2017-05-28 ENCOUNTER — Telehealth: Payer: Self-pay | Admitting: Primary Care

## 2017-05-28 NOTE — Telephone Encounter (Signed)
Copied from [place] [id number]. Topic: Quick Communication - See Telephone Encounter >> May 28, 2017 11:22 AM Boyd Kerbs wrote: CRM for notification.   Pt. Is calling about reverse T-3 results   See Telephone encounter for: 05/28/17.

## 2017-05-29 ENCOUNTER — Encounter: Payer: Self-pay | Admitting: *Deleted

## 2017-05-29 NOTE — Telephone Encounter (Signed)
Please apologize in my behalf, her reverse T3 results were normal. As mentioned, she needs to stop Cytomel and we need to repeat TSH 6 weeks after she stops taking.

## 2017-05-30 NOTE — Telephone Encounter (Signed)
Spoken and notified patient of Kathryn Fields comments on 05/29/2017. Patient verbalized understanding.

## 2017-05-31 ENCOUNTER — Telehealth: Payer: Self-pay | Admitting: Obstetrics and Gynecology

## 2017-05-31 NOTE — Telephone Encounter (Signed)
Pt aware of DEXA results at [facility]. Osteoporosis in spine (T-2.9) and normal bone in hip (T-0.3). Spine is worse since 2011. Pt getting calcium/Vit D/exercise/ and ERT. Rechk in 2 yrs.  FRAX shows mj osteo event=6.8%; hip fx=0.3%.

## 2017-06-04 ENCOUNTER — Telehealth: Payer: Self-pay | Admitting: Obstetrics and Gynecology

## 2017-06-04 DIAGNOSIS — R928 Other abnormal and inconclusive findings on diagnostic imaging of breast: Secondary | ICD-10-CM

## 2017-06-04 NOTE — Telephone Encounter (Signed)
Order placed for [facility]. Nancy to sched. Pt understands. Will f/u with results.

## 2017-06-04 NOTE — Telephone Encounter (Signed)
Pt calling to report that mammo returned with lump in her right breast and [facility] does not do diagnostic imaging and pt does not wish to go to [place]. Pt would like orders for diagnotic ultrasound to be done at [facility] and have her information transferred there and any future mammograms to be done at [facility]. Pt cb# [phone] thank you.

## 2017-06-05 NOTE — Telephone Encounter (Signed)
Melissa @ [facility] to request images from [facility], which must be received before scheduling. Per Lenna Sciara, they usually take a few days. I attempted to reach the patient on her home#, but no answer, no v/m. Lmtrc on cell#.

## 2017-06-05 NOTE — Telephone Encounter (Signed)
Patient returned the call and is aware someone from [facility] will contact her when the images are received.

## 2017-06-20 ENCOUNTER — Ambulatory Visit
Admission: RE | Admit: 2017-06-20 | Discharge: 2017-06-20 | Disposition: A | Discharge status: Home / Self Care | Payer: Medicare Other | Source: Ambulatory Visit | Attending: Obstetrics and Gynecology | Admitting: Obstetrics and Gynecology

## 2017-06-20 DIAGNOSIS — N6001 Solitary cyst of right breast: Secondary | ICD-10-CM | POA: Insufficient documentation

## 2017-06-20 DIAGNOSIS — R928 Other abnormal and inconclusive findings on diagnostic imaging of breast: Secondary | ICD-10-CM

## 2017-06-21 ENCOUNTER — Encounter: Payer: Self-pay | Admitting: Obstetrics and Gynecology

## 2017-06-27 ENCOUNTER — Other Ambulatory Visit: Payer: Self-pay | Admitting: Primary Care

## 2017-06-27 DIAGNOSIS — E039 Hypothyroidism, unspecified: Secondary | ICD-10-CM

## 2017-07-11 ENCOUNTER — Telehealth: Payer: Self-pay | Admitting: *Deleted

## 2017-07-11 DIAGNOSIS — E039 Hypothyroidism, unspecified: Secondary | ICD-10-CM

## 2017-07-11 NOTE — Telephone Encounter (Signed)
Copied from [place] [id number]. Topic: Referral - Request >> Jul 11, 2017 10:19 AM Synthia Innocent wrote: Reason for CRM: Requesting referral Endocrinologist, Dr Tillman Sers [facility] # [phone] for thyroid

## 2017-07-11 NOTE — Telephone Encounter (Signed)
Noted, referral placed.  

## 2018-03-06 DIAGNOSIS — E039 Hypothyroidism, unspecified: Secondary | ICD-10-CM | POA: Diagnosis not present

## 2018-03-06 DIAGNOSIS — E669 Obesity, unspecified: Secondary | ICD-10-CM | POA: Diagnosis not present

## 2018-03-06 DIAGNOSIS — E2839 Other primary ovarian failure: Secondary | ICD-10-CM | POA: Diagnosis not present

## 2018-03-06 DIAGNOSIS — R5383 Other fatigue: Secondary | ICD-10-CM | POA: Diagnosis not present

## 2018-03-13 DIAGNOSIS — D2372 Other benign neoplasm of skin of left lower limb, including hip: Secondary | ICD-10-CM | POA: Diagnosis not present

## 2018-03-13 DIAGNOSIS — D2371 Other benign neoplasm of skin of right lower limb, including hip: Secondary | ICD-10-CM | POA: Diagnosis not present

## 2018-03-13 DIAGNOSIS — L821 Other seborrheic keratosis: Secondary | ICD-10-CM | POA: Diagnosis not present

## 2018-05-09 ENCOUNTER — Other Ambulatory Visit: Payer: Self-pay | Admitting: Obstetrics and Gynecology

## 2018-05-09 DIAGNOSIS — N951 Menopausal and female climacteric states: Secondary | ICD-10-CM

## 2018-05-09 DIAGNOSIS — Z79890 Hormone replacement therapy (postmenopausal): Secondary | ICD-10-CM

## 2018-05-09 NOTE — Telephone Encounter (Signed)
Please advise 

## 2018-05-22 DIAGNOSIS — E2839 Other primary ovarian failure: Secondary | ICD-10-CM | POA: Diagnosis not present

## 2018-05-22 DIAGNOSIS — E669 Obesity, unspecified: Secondary | ICD-10-CM | POA: Diagnosis not present

## 2018-05-22 DIAGNOSIS — E039 Hypothyroidism, unspecified: Secondary | ICD-10-CM | POA: Diagnosis not present

## 2018-07-24 DIAGNOSIS — H2513 Age-related nuclear cataract, bilateral: Secondary | ICD-10-CM | POA: Diagnosis not present

## 2018-08-01 ENCOUNTER — Other Ambulatory Visit: Payer: Self-pay | Admitting: Obstetrics and Gynecology

## 2018-08-01 DIAGNOSIS — N951 Menopausal and female climacteric states: Secondary | ICD-10-CM

## 2018-08-01 DIAGNOSIS — Z79890 Hormone replacement therapy (postmenopausal): Secondary | ICD-10-CM

## 2018-08-01 NOTE — Telephone Encounter (Signed)
Please advise 

## 2018-08-18 ENCOUNTER — Telehealth: Payer: Self-pay

## 2018-08-18 NOTE — Telephone Encounter (Signed)
Pt calling; has appt 8/13; needs estradiol tabs 5mg  and progesterone 100mg  refill sent to [facility].  Is no longer on Biest.  Doesn't have enough to get to apt.  [phone]

## 2018-08-18 NOTE — Telephone Encounter (Signed)
Please advise 

## 2018-08-19 NOTE — Telephone Encounter (Signed)
Trying to reach pt to confirm estradiol dose. Was on Biest 5 mg but dosing is different with estradiol.

## 2018-08-20 ENCOUNTER — Other Ambulatory Visit: Payer: Self-pay | Admitting: Obstetrics and Gynecology

## 2018-08-20 DIAGNOSIS — N951 Menopausal and female climacteric states: Secondary | ICD-10-CM

## 2018-08-20 DIAGNOSIS — Z79890 Hormone replacement therapy (postmenopausal): Secondary | ICD-10-CM

## 2018-08-20 MED ORDER — ESTRADIOL 0.5 MG PO TABS: 0.5000 mg | ORAL_TABLET | Freq: Every day | ORAL | 0 refills | Status: DC

## 2018-08-20 MED ORDER — PROGESTERONE MICRONIZED 100 MG PO CAPS
100.0000 mg | ORAL_CAPSULE | Freq: Every day | ORAL | 0 refills | Status: DC
Start: 2018-08-20 — End: 2018-09-18

## 2018-08-20 NOTE — Progress Notes (Signed)
Rx RF estradiol and prometrium till 8/20 annual.

## 2018-08-20 NOTE — Telephone Encounter (Signed)
Rxs clarified and eRxd. F/u at 8/20 annual

## 2018-09-18 ENCOUNTER — Other Ambulatory Visit: Payer: Self-pay

## 2018-09-18 ENCOUNTER — Encounter: Payer: Self-pay | Admitting: Obstetrics and Gynecology

## 2018-09-18 ENCOUNTER — Ambulatory Visit (INDEPENDENT_AMBULATORY_CARE_PROVIDER_SITE_OTHER): Payer: Medicare HMO | Admitting: Obstetrics and Gynecology

## 2018-09-18 VITALS — BP 140/90 | Ht 64.0 in | Wt 183.4 lb

## 2018-09-18 DIAGNOSIS — Z1239 Encounter for other screening for malignant neoplasm of breast: Secondary | ICD-10-CM

## 2018-09-18 DIAGNOSIS — Z1211 Encounter for screening for malignant neoplasm of colon: Secondary | ICD-10-CM

## 2018-09-18 DIAGNOSIS — Z01419 Encounter for gynecological examination (general) (routine) without abnormal findings: Secondary | ICD-10-CM | POA: Diagnosis not present

## 2018-09-18 DIAGNOSIS — N951 Menopausal and female climacteric states: Secondary | ICD-10-CM

## 2018-09-18 DIAGNOSIS — Z79890 Hormone replacement therapy (postmenopausal): Secondary | ICD-10-CM

## 2018-09-18 DIAGNOSIS — N7011 Chronic salpingitis: Secondary | ICD-10-CM

## 2018-09-18 LAB — HEMOCCULT GUIAC POC 1CARD (OFFICE): Fecal Occult Blood, POC: NEGATIVE

## 2018-09-18 MED ORDER — PROGESTERONE MICRONIZED 100 MG PO CAPS: 100.0000 mg | ORAL_CAPSULE | Freq: Every day | ORAL | 3 refills | Status: DC

## 2018-09-18 MED ORDER — ESTRADIOL 0.5 MG PO TABS: 0.5000 mg | ORAL_TABLET | Freq: Every day | ORAL | 3 refills | Status: DC

## 2018-09-18 NOTE — Patient Instructions (Signed)
I value your feedback and entrusting us with your care. If you get a [facility] patient survey, I would appreciate you taking the time to let us know about your experience today. Thank you! 

## 2018-09-18 NOTE — Progress Notes (Signed)
PCP: Kathryn Koch, NP   Chief Complaint  Patient presents with  . Gynecologic Exam    HPI:      [name] is a 68 y.o. [phone] who LMP was No LMP recorded. Patient is postmenopausal., presents today for her annual examination.  Her menses are absent due to menopause. She does not have intermenstrual bleeding. She does have vasomotor sx and treats with estradiol 0.5 mg and prometrium 100 mg caps nightly with mostly relief. She was on biest but changed to estradiol. Doing well. Wants to cont.   Sex activity: single partner, contraception - post menopausal status. She does have vaginal dryness, uses lubricants with relief. May need vag ERT in future.   Last Pap: December 14, 2014  Results were: no abnormalities /neg HPV DNA.  Hx of STDs: none  Last mammogram: 06/20/17  Results were: normal--routine follow-up in 12 months There is no FH of breast cancer. There is no FH of ovarian cancer. The patient does not do self-breast exams.  Colonoscopy: never; would like to do cologuard. No rectal bleeding.  Tobacco use: The patient denies current or previous tobacco use. Alcohol use: none  Drug use: none Exercise: min active  She does get adequate calcium and Vitamin D in her diet.  DEXA: 05/2017 at [facility]. 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 Pt on estradiol 0.5 mg. Getting calcium/Vit D. Repeat due 2021.  Labs with PCP.  She is being followed for RT hydrosalpinx noted on u/s 2018. She had neg ca-125 1/19. No pain/sx. F/u u/s done 3/19 with stable, dilated RT fallopian tube. Pt s/p TL in past. Declines GYN u/s f/u today.  Past Medical History:  Diagnosis Date  . Chickenpox   . 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 History of bone density study 12/19/2009   BIBC  . History of mammogram 09/28/2009   BIBC BIRAD 2; BIRADS 0 ON 05/12/15 RT BR MASS; COMP VIEWS AND U/S - SM CYST AT 9:00.  . History of Papanicolaou smear of cervix 12/23/2014   -/-  . Hypercholesteremia   .  Hyperthyroidism   . Hypothyroidism (acquired)    AFTER THYROID ABLATION  . Osteoporosis    OF THE SPINE  . Periumbilical hernia     Past Surgical History:  Procedure Laterality Date  . Index  . MYOMECTOMY  1978-79   FIBROID REMOVED  . THYROID SURGERY     ABLATION  . TUBAL LIGATION  1989    Family History  Problem Relation Age of Onset  . Hypertension Mother   . Neurologic Disorder Mother   . Ulcerative colitis Mother   . Hypertension Father   . Heart attack Father   . Lymphoma Maternal Aunt   . Diabetes Maternal Grandmother   . Cancer - Cervical Paternal Grandmother   . Heart attack Paternal Grandfather   . Uterine cancer Maternal Aunt        ?    Social History   Socioeconomic History  . Marital status: Married    Spouse name: Not on file  . Number of children: Not on file  . Years of education: 75  . Highest education level: Not on file  Occupational History  . Occupation: RETIRED  Social Needs  . Financial resource strain: Not on file  . Food insecurity    Worry: Not on file    Inability: Not on file  . Transportation needs    Medical: Not on file  Non-medical: Not on file  Tobacco Use  . Smoking status: Never Smoker  . Smokeless tobacco: Never Used  Substance and Sexual Activity  . Alcohol use: No  . Drug use: No  . Sexual activity: Yes    Birth control/protection: Post-menopausal  Lifestyle  . Physical activity    Days per week: Not on file    Minutes per session: Not on file  . Stress: Not on file  Relationships  . Social Herbalist on phone: Not on file    Gets together: Not on file    Attends religious service: Not on file    Active member of club or organization: Not on file    Attends meetings of clubs or organizations: Not on file    Relationship status: Not on file  . Intimate partner violence    Fear of current or ex partner: Not on file    Emotionally abused: Not on file    Physically abused: Not  on file    Forced sexual activity: Not on file  Other Topics Concern  . Not on file  Social History Narrative   Married.   3 children.   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   Enjoys bowling, traveling, reading.     Current Meds  Medication Sig  . AMBULATORY NON FORMULARY MEDICATION Take 12.5 mg by mouth daily   Lodorol - Iodine/Potassium Supplement  . Cholecalciferol (VITAMIN D3) LIQD Take 5,000 Units by mouth daily.  Marland Kitchen estradiol (ESTRACE) 0.5 MG tablet Take 1 tablet (0.5 mg total) by mouth daily.  . Methylcobalamin 1000 MCG TBDP Take 1,000 mcg by mouth daily.  . progesterone (PROMETRIUM) 100 MG capsule Take 1 capsule (100 mg total) by mouth daily.  Marland Kitchen thyroid (ARMOUR THYROID) 90 MG tablet Take 1 tablet (90 mg total) by mouth daily.  . vitamin E 400 UNIT capsule Take 400 Units by mouth daily.  . [DISCONTINUED] estradiol (ESTRACE) 0.5 MG tablet Take 1 tablet (0.5 mg total) by mouth daily.  . [DISCONTINUED] progesterone (PROMETRIUM) 100 MG capsule Take 1 capsule (100 mg total) by mouth daily.      ROS:  Review of Systems  Constitutional: Negative for fatigue, fever and unexpected weight change.  Respiratory: Negative for cough, shortness of breath and wheezing.   Cardiovascular: Negative for chest pain, palpitations and leg swelling.  Gastrointestinal: Negative for blood in stool, constipation, diarrhea, nausea and vomiting.  Endocrine: Negative for cold intolerance, heat intolerance and polyuria.  Genitourinary: Negative for dyspareunia, dysuria, flank pain, frequency, genital sores, hematuria, menstrual problem, pelvic pain, urgency, vaginal bleeding, vaginal discharge and vaginal pain.  Musculoskeletal: Negative for back pain, joint swelling and myalgias.  Skin: Negative for rash.  Neurological: Negative for dizziness, syncope, light-headedness, numbness and headaches.  Hematological: Negative for adenopathy.  Psychiatric/Behavioral: Negative for agitation, confusion,  sleep disturbance and suicidal ideas. The patient is not nervous/anxious.      Objective: BP 140/90   Ht 5\' 4"  (1.626 m)   Wt 183 lb 6.4 oz (83.2 kg)   BMI 31.48 kg/m    Physical Exam Constitutional:      Appearance: She is well-developed.  Genitourinary:     Vulva, vagina, uterus, right adnexa, left adnexa and rectum normal.     No vulval lesion or tenderness noted.     No vaginal discharge, erythema or tenderness.     No cervical motion tenderness or polyp.     Uterus is not enlarged or tender.  No right or left adnexal mass present.     Right adnexa not tender.     Left adnexa not tender.  Rectum:     Guaiac result negative.     No anal fissure.  Neck:     Musculoskeletal: Normal range of motion.     Thyroid: No thyromegaly.  Cardiovascular:     Rate and Rhythm: Normal rate and regular rhythm.     Heart sounds: Normal heart sounds. No murmur.  Pulmonary:     Effort: Pulmonary effort is normal.     Breath sounds: Normal breath sounds.  Chest:     Breasts:        Right: No mass, nipple discharge, skin change or tenderness.        Left: No mass, nipple discharge, skin change or tenderness.  Abdominal:     Palpations: Abdomen is soft.     Tenderness: There is no abdominal tenderness. There is no guarding.  Musculoskeletal: Normal range of motion.  Neurological:     General: No focal deficit present.     Mental Status: She is alert and oriented to person, place, and time.     Cranial Nerves: No cranial nerve deficit.  Skin:    General: Skin is warm and dry.  Psychiatric:        Mood and Affect: Mood normal.        Behavior: Behavior normal.        Thought Content: Thought content normal.        Judgment: Judgment normal.  Vitals signs reviewed.     Results: Results for orders placed or performed in visit on 09/18/18 (from the past 24 hour(s))  POCT Occult Blood Stool     Status: Normal   Collection Time: 09/18/18  4:23 PM  Result Value Ref Range    Fecal Occult Blood, POC Negative Negative   Card #1 Date     Card #2 Fecal Occult Blod, POC     Card #2 Date     Card #3 Fecal Occult Blood, POC     Card #3 Date      Assessment/Plan: Encounter for annual routine gynecological examination -   Screening for breast cancer - Plan: MM 3D SCREEN BREAST BILATERAL, Pt to sched mammo  Hormone replacement therapy (HRT) - Plan: estradiol (ESTRACE) 0.5 MG tablet, progesterone (PROMETRIUM) 100 MG capsule, Rx RF eRxd to mail order. Re-eval in 1 yr.  Vasomotor symptoms due to menopause - Plan: estradiol (ESTRACE) 0.5 MG tablet, progesterone (PROMETRIUM) 100 MG capsule, Improved with HRT.   Screening for colon cancer - Plan: POCT Occult Blood Stool, Cologuard, Desires cologuard. Ref placed. Neg FOBT.  Hydrosalpinx - Plan: Declines GYN u/s for eval this yr. F/u prn.    Meds ordered this encounter  Medications  . estradiol (ESTRACE) 0.5 MG tablet    Sig: Take 1 tablet (0.5 mg total) by mouth daily.    Dispense:  90 tablet    Refill:  3    Order Specific Question:   Supervising Provider    Answer:   Gae Dry [id number]  . progesterone (PROMETRIUM) 100 MG capsule    Sig: Take 1 capsule (100 mg total) by mouth daily.    Dispense:  90 capsule    Refill:  3    Order Specific Question:   Supervising Provider    Answer:   Gae Dry [[id number]]    GYN counsel breast self exam, mammography screening, menopause, osteoporosis, adequate intake  of calcium and vitamin D, diet and exercise    F/U  Return in about 1 year (around 09/18/2019).   B. , PA-C 09/18/2018 4:27 PM

## 2018-10-14 DIAGNOSIS — Z1211 Encounter for screening for malignant neoplasm of colon: Secondary | ICD-10-CM

## 2018-10-14 HISTORY — DX: Encounter for screening for malignant neoplasm of colon: Z12.11

## 2018-10-18 LAB — COLOGUARD: Cologuard: NEGATIVE

## 2018-10-20 ENCOUNTER — Telehealth: Payer: Self-pay | Admitting: Obstetrics and Gynecology

## 2018-10-20 ENCOUNTER — Encounter: Payer: Self-pay | Admitting: Obstetrics and Gynecology

## 2018-10-20 NOTE — Telephone Encounter (Signed)
Nurse to notify pt of neg Cologuard results. Repeat in 3 yrs.

## 2018-10-21 NOTE — Telephone Encounter (Signed)
Pt aware of results 

## 2018-12-01 ENCOUNTER — Other Ambulatory Visit: Payer: Self-pay

## 2018-12-01 DIAGNOSIS — E039 Hypothyroidism, unspecified: Secondary | ICD-10-CM

## 2018-12-01 NOTE — Telephone Encounter (Signed)
Please kindly notify patient that we've received no such request from any pharmacy for refills. The last refill I sent was in May 2019 for a 6 month supply, when did she run out? Should have been around November 2019?  Needs office visit with labs for further refills.

## 2018-12-01 NOTE — Telephone Encounter (Signed)
Patient left a message on triage line asking for refill on Armour Thyroid medication and states [facility] pharmacy has sent over requests. I do not see any request in the computer unless it was a paper request. LOV was 05/20/2017. No future appointments. Ok to refill if patient makes an appointment?

## 2018-12-03 MED ORDER — THYROID 90 MG PO TABS: ORAL_TABLET | ORAL | 0 refills | Status: DC

## 2018-12-03 NOTE — Addendum Note (Signed)
Addended by: Pleas Koch on: 12/03/2018 08:29 PM   Modules accepted: Orders

## 2018-12-03 NOTE — Telephone Encounter (Signed)
Yes, this is correct Ar[place] 90 mg.

## 2018-12-03 NOTE — Telephone Encounter (Signed)
Was able to get a hold of patient. She stated that she did not run out. She saw someone else in between then and now. Patient wants to come back to [place]. Follow up appointment has been schedule for 12/09/2018. Patient asked if Allie Bossier can go ahead and sent a refill. To [facility].

## 2018-12-03 NOTE — Telephone Encounter (Signed)
I'm assuming that we still have the correct dose of her Armour Thyroid on file, 90 mg? Please verify.  We will see her next week as scheduled.

## 2018-12-03 NOTE — Telephone Encounter (Signed)
Noted, refill sent to pharmacy. 

## 2018-12-09 ENCOUNTER — Encounter: Payer: Self-pay | Admitting: Primary Care

## 2018-12-09 ENCOUNTER — Ambulatory Visit (INDEPENDENT_AMBULATORY_CARE_PROVIDER_SITE_OTHER): Payer: Medicare HMO | Admitting: Primary Care

## 2018-12-09 ENCOUNTER — Other Ambulatory Visit: Payer: Self-pay | Admitting: Primary Care

## 2018-12-09 ENCOUNTER — Other Ambulatory Visit: Payer: Self-pay

## 2018-12-09 VITALS — BP 136/86 | HR 86 | Temp 98.0°F | Ht 64.0 in | Wt 181.8 lb

## 2018-12-09 DIAGNOSIS — R232 Flushing: Secondary | ICD-10-CM

## 2018-12-09 DIAGNOSIS — Z13220 Encounter for screening for lipoid disorders: Secondary | ICD-10-CM | POA: Diagnosis not present

## 2018-12-09 DIAGNOSIS — E039 Hypothyroidism, unspecified: Secondary | ICD-10-CM | POA: Diagnosis not present

## 2018-12-09 DIAGNOSIS — R030 Elevated blood-pressure reading, without diagnosis of hypertension: Secondary | ICD-10-CM | POA: Diagnosis not present

## 2018-12-09 LAB — COMPREHENSIVE METABOLIC PANEL
ALT: 12 U/L (ref 0–35)
AST: 15 U/L (ref 0–37)
Albumin: 4.4 g/dL (ref 3.5–5.2)
Alkaline Phosphatase: 65 U/L (ref 39–117)
BUN: 19 mg/dL (ref 6–23)
CO2: 30 mEq/L (ref 19–32)
Calcium: 10.1 mg/dL (ref 8.4–10.5)
Chloride: 103 mEq/L (ref 96–112)
Creatinine, Ser: 0.79 mg/dL (ref 0.40–1.20)
GFR: 72.62 mL/min (ref >60.00)
Glucose, Bld: 89 mg/dL (ref 70–99)
Potassium: 4.4 mEq/L (ref 3.5–5.1)
Sodium: 139 mEq/L (ref 135–145)
Total Bilirubin: 0.6 mg/dL (ref 0.2–1.2)
Total Protein: 6.7 g/dL (ref 6.0–8.3)

## 2018-12-09 LAB — T4, FREE: Free T4: 0.88 ng/dL (ref 0.60–1.60)

## 2018-12-09 LAB — LIPID PANEL
Cholesterol: 252 mg/dL — ABNORMAL HIGH (ref 0–200)
HDL: 61.8 mg/dL (ref >39.00)
LDL Cholesterol: 158 mg/dL — ABNORMAL HIGH (ref 0–99)
NonHDL: 190.14
Total CHOL/HDL Ratio: 4
Triglycerides: 162 mg/dL — ABNORMAL HIGH (ref 0.0–149.0)
VLDL: 32.4 mg/dL (ref 0.0–40.0)

## 2018-12-09 LAB — T3, FREE: T3, Free: 4.6 pg/mL — ABNORMAL HIGH (ref 2.3–4.2)

## 2018-12-09 LAB — TSH: TSH: <0.01 u[IU]/mL — ABNORMAL LOW (ref 0.35–4.50)

## 2018-12-09 NOTE — Assessment & Plan Note (Signed)
Improved from last year, borderline this year. Encouraged regular exercise, healthy diet. Continue to monitor.

## 2018-12-09 NOTE — Patient Instructions (Signed)
Stop by the lab prior to leaving today. I will notify you of your results once received.   Complete your mammogram as discussed.  Start exercising. You should be getting 150 minutes of moderate intensity exercise weekly.  Continue to work on a healthy diet.  It was a pleasure to see you today!

## 2018-12-09 NOTE — Assessment & Plan Note (Signed)
Chronic, doing well on HRT. Following with GYN. Encouraged her to schedule her mammogram.

## 2018-12-09 NOTE — Assessment & Plan Note (Signed)
Compliant to Armour Thyroid and taking appropriately.  Repeat thyroid labs pending.

## 2018-12-09 NOTE — Progress Notes (Signed)
Subjective:    Patient ID: Kathryn Fields, female    DOB: 1950-08-09, 68 y.o.   MRN: [id number]  HPI  Kathryn Fields is a 68 year old female who presents today for follow up of chronic conditions and medication refill. She follows with GYN annually.  1) Hypothyroidism: Currently managed on Armour Thyroid 90 mg. Her last TSH was <0.01 in April 2019, was managed on Cytomel during last visit and has since stopped taking.  She is taking her Armour Thyroid in the morning with water only and is waiting to eat and taking other medications within 30 minutes. She is taking vitamins and Magnesium at bedtime.   2) HRT: Currently managed on estradiol and Prometrium for hot flashes, following with GYN. She is working on scheduling her mammogram.   BP Readings from Last 3 Encounters:  12/09/18 136/86  09/18/18 140/90  05/20/17 136/78   Wt Readings from Last 3 Encounters:  12/09/18 181 lb 12 oz (82.4 kg)  09/18/18 183 lb 6.4 oz (83.2 kg)  05/20/17 176 lb 8 oz (80.1 kg)     Review of Systems  Constitutional: Negative for fatigue.  Eyes: Negative for visual disturbance.  Respiratory: Negative for shortness of breath.   Cardiovascular: Negative for chest pain.  Neurological: Negative for headaches.       Past Medical History:  Diagnosis Date  . Chickenpox   . Colon cancer screening 10/14/2018   neg cologuard test; repeat in 3 yrs  . 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 History of bone density study 12/19/2009   BIBC  . History of mammogram 09/28/2009   BIBC BIRAD 2; BIRADS 0 ON 05/12/15 RT BR MASS; COMP VIEWS AND U/S - SM CYST AT 9:00.  . History of Papanicolaou smear of cervix 12/23/2014   -/-  . Hypercholesteremia   . Hyperthyroidism   . Hypothyroidism (acquired)    AFTER THYROID ABLATION  . Osteoporosis    OF THE SPINE  . Periumbilical hernia      Social History   Socioeconomic History  . Marital status: Married    Spouse name: Not on file  . Number of children: Not on  file  . Years of education: 62  . Highest education level: Not on file  Occupational History  . Occupation: RETIRED  Social Needs  . Financial resource strain: Not on file  . Food insecurity    Worry: Not on file    Inability: Not on file  . Transportation needs    Medical: Not on file    Non-medical: Not on file  Tobacco Use  . Smoking status: Never Smoker  . Smokeless tobacco: Never Used  Substance and Sexual Activity  . Alcohol use: No  . Drug use: No  . Sexual activity: Yes    Birth control/protection: Post-menopausal  Lifestyle  . Physical activity    Days per week: Not on file    Minutes per session: Not on file  . Stress: Not on file  Relationships  . Social Herbalist on phone: Not on file    Gets together: Not on file    Attends religious service: Not on file    Active member of club or organization: Not on file    Attends meetings of clubs or organizations: Not on file    Relationship status: Not on file  . Intimate partner violence    Fear of current or ex partner: Not on file    Emotionally abused: Not  on file    Physically abused: Not on file    Forced sexual activity: Not on file  Other Topics Concern  . Not on file  Social History Narrative   Married.   3 children.   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   Enjoys bowling, traveling, reading.     Past Surgical History:  Procedure Laterality Date  . [place]  . MYOMECTOMY  1978-79   FIBROID REMOVED  . THYROID SURGERY     ABLATION  . TUBAL LIGATION  1989    Family History  Problem Relation Age of Onset  . Hypertension Mother   . Neurologic Disorder Mother   . Ulcerative colitis Mother   . Hypertension Father   . Heart attack Father   . Lymphoma Maternal Aunt   . Diabetes Maternal Grandmother   . Cancer - Cervical Paternal Grandmother   . Heart attack Paternal Grandfather   . Uterine cancer Maternal Aunt        ?    Allergies  Allergen Reactions  .  Iodinated Diagnostic Agents     Iv dye,iodine containing contrast media  . Iodine Rash    Current Outpatient Medications on File Prior to Visit  Medication Sig Dispense Refill  . AMBULATORY NON FORMULARY MEDICATION Take 12.5 mg by mouth daily   Iodorol - Iodine/Potassium Supplement    . Cholecalciferol (VITAMIN D3) LIQD Take 5,000 Units by mouth daily.    Marland Kitchen estradiol (ESTRACE) 0.5 MG tablet Take 1 tablet (0.5 mg total) by mouth daily. 90 tablet 3  . Methylcobalamin 1000 MCG TBDP Take 1,000 mcg by mouth daily.    . progesterone (PROMETRIUM) 100 MG capsule Take 1 capsule (100 mg total) by mouth daily. 90 capsule 3  . thyroid (ARMOUR THYROID) 90 MG tablet Take 1 tablet by mouth every morning on an empty stomach with water only.  No food or other medications for 30 minutes. 90 tablet 0  . vitamin E 400 UNIT capsule Take 400 Units by mouth daily.    Marland Kitchen VITAMIN K PO Take 90 mcg by mouth daily.     No current facility-administered medications on file prior to visit.     BP 136/86   Pulse 86   Temp 98 F (36.7 C) (Temporal)   Ht 5\' 4"  (1.626 m)   Wt 181 lb 12 oz (82.4 kg)   SpO2 98%   BMI 31.20 kg/m    Objective:   Physical Exam  Constitutional: She appears well-nourished.  Neck: Neck supple.  Cardiovascular: Normal rate and regular rhythm.  Respiratory: Effort normal and breath sounds normal.  Skin: Skin is warm and dry.           Assessment & Plan:

## 2019-01-05 ENCOUNTER — Encounter: Payer: Self-pay | Admitting: *Deleted

## 2019-01-05 ENCOUNTER — Ambulatory Visit
Admission: EM | Admit: 2019-01-05 | Discharge: 2019-01-05 | Disposition: A | Discharge status: Home / Self Care | Payer: Medicare HMO | Attending: Emergency Medicine | Admitting: Emergency Medicine

## 2019-01-05 DIAGNOSIS — Z20828 Contact with and (suspected) exposure to other viral communicable diseases: Secondary | ICD-10-CM | POA: Diagnosis not present

## 2019-01-05 DIAGNOSIS — Z20822 Contact with and (suspected) exposure to covid-19: Secondary | ICD-10-CM

## 2019-01-05 NOTE — ED Provider Notes (Signed)
[name]    CSN: [id number] Arrival date & time: 01/05/19  1021      History   Chief Complaint Chief Complaint  Patient presents with  . COVID Test    HPI Kathryn Fields is a 68 y.o. female.   Patient presents with request for a COVID test.  She was exposed to a positive family member 8 days ago.  She denies symptoms, including fever, chills, sore throat, rhinorrhea, congestion, cough, shortness of breath, vomiting, diarrhea, rash.  No treatments attempted at home.  The history is provided by the patient.    Past Medical History:  Diagnosis Date  . Chickenpox   . Colon cancer screening 10/14/2018   neg cologuard test; repeat in 3 yrs  . 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 History of bone density study 12/19/2009   BIBC  . History of mammogram 09/28/2009   BIBC BIRAD 2; BIRADS 0 ON 05/12/15 RT BR MASS; COMP VIEWS AND U/S - SM CYST AT 9:00.  . History of Papanicolaou smear of cervix 12/23/2014   -/-  . Hypercholesteremia   . Hyperthyroidism   . Hypothyroidism (acquired)    AFTER THYROID ABLATION  . Osteoporosis    OF THE SPINE  . Periumbilical hernia     Patient Active Problem List   Diagnosis Date Noted  . 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 Hypothyroidism 05/11/2016  . Hot flashes 05/11/2016  . Elevated blood pressure reading 05/11/2016    Past Surgical History:  Procedure Laterality Date  . [place]  . MYOMECTOMY  1978-79   FIBROID REMOVED  . THYROID SURGERY     ABLATION  . TUBAL LIGATION  1989    OB History    Gravida  4   Para  3   Term  3   Preterm      AB  1   Living  3     SAB  1   TAB      Ectopic      Multiple      Live Births  3            Home Medications    Prior to Admission medications   Medication Sig Start Date End Date Taking? Authorizing Provider  AMBULATORY NON FORMULARY MEDICATION Take 12.5 mg by mouth daily   Iodorol - Iodine/Potassium Supplement    [provider]   Cholecalciferol (VITAMIN D3) LIQD Take 5,000 Units by mouth daily.    [provider]  estradiol (ESTRACE) 0.5 MG tablet Take 1 tablet (0.5 mg total) by mouth daily. [id number]   Copland, Deirdre Evener, PA-C  Methylcobalamin 1000 MCG TBDP Take 1,000 mcg by mouth daily.    [provider]  progesterone (PROMETRIUM) 100 MG capsule Take 1 capsule (100 mg total) by mouth daily. [id number]   Copland, Deirdre Evener, PA-C  thyroid (ARMOUR THYROID) 90 MG tablet Take 1 tablet by mouth every morning on an empty stomach with water only.  No food or other medications for 30 minutes. 12/03/18   Pleas Koch, NP  vitamin E 400 UNIT capsule Take 400 Units by mouth daily.    [provider]  VITAMIN K PO Take 90 mcg by mouth daily.    [provider]    Family History Family History  Problem Relation Age of Onset  . Hypertension Mother   . Neurologic Disorder Mother   . Ulcerative colitis Mother   . Hypertension Father   .  Heart attack Father   . Lymphoma Maternal Aunt   . Diabetes Maternal Grandmother   . Cancer - Cervical Paternal Grandmother   . Heart attack Paternal Grandfather   . Uterine cancer Maternal Aunt        ?    Social History Social History   Tobacco Use  . Smoking status: Never Smoker  . Smokeless tobacco: Never Used  Substance Use Topics  . Alcohol use: No  . Drug use: No     Allergies   Iodinated diagnostic agents and Iodine   Review of Systems Review of Systems  Constitutional: Negative for chills and fever.  HENT: Negative for congestion, ear pain, rhinorrhea and sore throat.   Eyes: Negative for pain and visual disturbance.  Respiratory: Negative for cough and shortness of breath.   Cardiovascular: Negative for chest pain and palpitations.  Gastrointestinal: Negative for abdominal pain, diarrhea, nausea and vomiting.  Genitourinary: Negative for dysuria and hematuria.  Musculoskeletal: Negative for arthralgias and back pain.  Skin:  Negative for color change and rash.  Neurological: Negative for seizures and syncope.  All other systems reviewed and are negative.    Physical Exam Triage Vital Signs ED Triage Vitals  Enc Vitals Group     BP 01/05/19 1025 134/85     Pulse Rate 01/05/19 1025 95     Resp 01/05/19 1025 17     Temp 01/05/19 1025 99 F (37.2 C)     Temp Source 01/05/19 1025 Oral     SpO2 01/05/19 1025 98 %     Weight --      Height --      Head Circumference --      Peak Flow --      Pain Score 01/05/19 1022 0     Pain Loc --      Pain Edu? --      Excl. in [place]? --    No data found.  Updated Vital Signs BP 134/85 (BP Location: Left Arm)   Pulse 95   Temp 99 F (37.2 C) (Oral)   Resp 17   SpO2 98%   Visual Acuity Right Eye Distance:   Left Eye Distance:   Bilateral Distance:    Right Eye Near:   Left Eye Near:    Bilateral Near:     Physical Exam Vitals signs and nursing note reviewed.  Constitutional:      General: She is not in acute distress.    Appearance: She is well-developed.  HENT:     Head: Normocephalic and atraumatic.     Right Ear: Tympanic membrane normal.     Left Ear: Tympanic membrane normal.     Nose: Nose normal.     Mouth/Throat:     Mouth: Mucous membranes are moist.     Pharynx: Oropharynx is clear.  Eyes:     Conjunctiva/sclera: Conjunctivae normal.  Neck:     Musculoskeletal: Neck supple.  Cardiovascular:     Rate and Rhythm: Normal rate and regular rhythm.     Heart sounds: No murmur.  Pulmonary:     Effort: Pulmonary effort is normal. No respiratory distress.     Breath sounds: Normal breath sounds.  Abdominal:     General: Bowel sounds are normal.     Palpations: Abdomen is soft.     Tenderness: There is no abdominal tenderness. There is no guarding or rebound.  Skin:    General: Skin is warm and dry.     Findings:  No rash.  Neurological:     General: No focal deficit present.     Mental Status: She is alert and oriented to person,  place, and time.      UC Treatments / Results  Labs (all labs ordered are listed, but only abnormal results are displayed) Labs Reviewed  NOVEL CORONAVIRUS, NAA    EKG   Radiology No results found.  Procedures Procedures (including critical care time)  Medications Ordered in UC Medications - No data to display  Initial Impression / Assessment and Plan / UC Course  I have reviewed the triage vital signs and the nursing notes.  Pertinent labs & imaging results that were available during my care of the patient were reviewed by me and considered in my medical decision making (see chart for details).    Patient request for COVID test following exposure to positive family member.  COVID test performed here.  Instructed patient to self quarantine until the test result is back.  Instructed patient to go to the emergency department if she develops high fever, shortness of breath, severe diarrhea, or other concerning symptoms.  Patient agrees with plan of care.   Final Clinical Impressions(s) / UC Diagnoses   Final diagnoses:  Exposure to COVID-19 virus     Discharge Instructions     Your COVID test is pending.  You should self quarantine until your test result is back and is negative.    Go to the emergency department if you develop high fever, shortness of breath, severe diarrhea, or other concerning symptoms.       ED Prescriptions    None     PDMP not reviewed this encounter.   Sharion Balloon, NP 01/05/19 1106

## 2019-01-05 NOTE — ED Triage Notes (Addendum)
Patient would like COVID testing, no symptoms. States that she was exposed last  weekend (8 days ago) to someone who has test positive.

## 2019-01-05 NOTE — Discharge Instructions (Addendum)
Your COVID test is pending.  You should self quarantine until your test result is back and is negative.   ° °Go to the emergency department if you develop high fever, shortness of breath, severe diarrhea, or other concerning symptoms.   ° °

## 2019-01-06 LAB — NOVEL CORONAVIRUS, NAA: SARS-CoV-2, NAA: NOT DETECTED

## 2019-02-17 DIAGNOSIS — E039 Hypothyroidism, unspecified: Secondary | ICD-10-CM

## 2019-02-19 MED ORDER — THYROID 60 MG PO TABS: ORAL_TABLET | ORAL | 0 refills | Status: DC

## 2019-02-19 MED ORDER — THYROID 15 MG PO TABS: ORAL_TABLET | ORAL | 0 refills | Status: DC

## 2019-05-07 ENCOUNTER — Other Ambulatory Visit: Payer: Self-pay | Admitting: Primary Care

## 2019-05-07 DIAGNOSIS — E039 Hypothyroidism, unspecified: Secondary | ICD-10-CM

## 2019-05-11 NOTE — Telephone Encounter (Signed)
Last prescribed on 02/19/2019  . Last lab and appointment on 12/09/2018. No future appointment

## 2019-05-12 NOTE — Telephone Encounter (Signed)
Patient was to have repeat thyroid labs since taking the reduced dose of Armour Thyroid. She needs repeat labs before we can continue refilling this medication. Confirm that she is taking both Armour Thyroid 60 mg and 15 mg. Please schedule the lab appointment for ASAP.

## 2019-05-14 ENCOUNTER — Telehealth (INDEPENDENT_AMBULATORY_CARE_PROVIDER_SITE_OTHER): Payer: Self-pay | Admitting: Internal Medicine

## 2019-05-14 NOTE — Telephone Encounter (Signed)
Did leave a message for patient yesterday.  However, she stated that she did try 75 mg for a little while but felt cold all the time and did not feel right. Patient went back and take 90 mg. She stated that she is not sure if she wants to come back here because she wants to stay at 90 mg. She will call back if she decide to keep Anda Kraft as pcp.

## 2019-05-14 NOTE — Telephone Encounter (Signed)
Noted. I cannot refill her prescription at the 90 mg strength based off of her last TSH and thyroid testing, it is very much unsafe. Will decline refill request.

## 2019-05-14 NOTE — Telephone Encounter (Signed)
She definitely needs to COME IN.

## 2019-05-26 ENCOUNTER — Encounter: Payer: Self-pay | Admitting: Family Medicine

## 2019-05-26 ENCOUNTER — Ambulatory Visit: Payer: Medicare HMO | Admitting: Family Medicine

## 2019-05-26 ENCOUNTER — Other Ambulatory Visit: Payer: Self-pay

## 2019-05-26 VITALS — BP 151/87 | HR 84 | Ht 64.0 in | Wt 183.0 lb

## 2019-05-26 DIAGNOSIS — E039 Hypothyroidism, unspecified: Secondary | ICD-10-CM

## 2019-05-26 DIAGNOSIS — E559 Vitamin D deficiency, unspecified: Secondary | ICD-10-CM | POA: Diagnosis not present

## 2019-05-26 NOTE — Progress Notes (Signed)
Establish care for thyroid

## 2019-05-26 NOTE — Progress Notes (Signed)
Office Visit Note   Patient: Kathryn Fields           Date of Birth: 02-20-1950           MRN: [id number] Visit Date: 05/26/2019 Requested by: Pleas Koch, NP [place],  [place] [address] PCP: Pleas Koch, NP  Subjective: Chief Complaint  Patient presents with  . establish primary care    HPI: She is here to establish care.  She found me through a functional medicine website.  In 2010 she was diagnosed with hyperthyroidism.  She was treated with radioactive iodine.  She subsequently became hypothyroid and was treated with Armour Thyroid.  Her dosage had to be adjusted a couple times, eventually she also was taking compounded T3 along with it.  This past year the T3 was stopped and now she is on 90 mg daily of Armour Thyroid and she feels well on this regimen.  When she lowers the dosage to 60 mg, she feels very fatigued.  She denies any hair loss, in/nail changes, diarrhea or unintentional weight change.  Denies any palpitations or chest pain.  She also has a history of vitamin D deficiency which is treated with over-the-counter supplements.  Finally, she has hormone imbalance managed with bioidentical hormones prescribed by her gynecologist.              ROS:   All other systems were reviewed and are negative.  Objective: Vital Signs: BP (!) 151/87   Pulse 84   Ht 5\' 4"  (1.626 m)   Wt 183 lb (83 kg)   BMI 31.41 kg/m   Physical Exam:  General:  Alert and oriented, in no acute distress. Pulm:  Breathing unlabored. Psy:  Normal mood, congruent affect. Skin: No rash. Neck: No thyromegaly or nodules. CV: Regular rate and rhythm without murmurs, rubs, or gallops.  No peripheral edema.  2+ radial and posterior tibial pulses. Lungs: Clear to auscultation throughout with no wheezing or areas of consolidation. Extremities: No nail deformities, 2+ upper and lower DTRs.   Imaging: No results found.  Assessment & Plan: 1.  Thyroid dysfunction,  hypothyroid status post radioactive iodine treatment. -We will draw labs to monitor.  She has clinically done well with a low TSH.  As long as she is not having any arrhythmias, or other side effects, we will maintain her current dosage and monitor periodically.  At some point she will need bone density test rechecked. -We will screen for thyroid antibodies as well.  2.  Vitamin D deficiency -Recheck today. -Follow-up in about 4 months.     Procedures: No procedures performed  No notes on file     PMFS History: Patient Active Problem List   Diagnosis Date Noted  . 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 Hypothyroidism 05/11/2016  . Hot flashes 05/11/2016  . Elevated blood pressure reading 05/11/2016   Past Medical History:  Diagnosis Date  . Chickenpox   . Colon cancer screening 10/14/2018   neg cologuard test; repeat in 3 yrs  . 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 History of bone density study 12/19/2009   BIBC  . History of mammogram 09/28/2009   BIBC BIRAD 2; BIRADS 0 ON 05/12/15 RT BR MASS; COMP VIEWS AND U/S - SM CYST AT 9:00.  . History of Papanicolaou smear of cervix 12/23/2014   -/-  . Hypercholesteremia   . Hyperthyroidism   . Hypothyroidism (acquired)    AFTER THYROID ABLATION  . Osteoporosis  OF THE SPINE  . Periumbilical hernia     Family History  Problem Relation Age of Onset  . Hypertension Mother   . Neurologic Disorder Mother   . Ulcerative colitis Mother   . Thyroid disease Mother   . Hypertension Father   . Heart attack Father   . Lymphoma Maternal Aunt   . Thyroid disease Maternal Aunt   . Diabetes Maternal Grandmother   . Cancer - Cervical Paternal Grandmother   . Cancer Paternal Grandmother   . Heart attack Paternal Grandfather   . Uterine cancer Maternal Aunt        ?  Marland Kitchen Thyroid disease Maternal Aunt   . Cancer Maternal Aunt     Past Surgical History:  Procedure Laterality Date  . [place]  . MYOMECTOMY  1978-79    FIBROID REMOVED  . THYROID SURGERY     ABLATION  . TUBAL LIGATION  1989   Social History   Occupational History  . Occupation: RETIRED  Tobacco Use  . Smoking status: Never Smoker  . Smokeless tobacco: Never Used  Substance and Sexual Activity  . Alcohol use: No  . Drug use: No  . Sexual activity: Yes    Birth control/protection: Post-menopausal

## 2019-05-29 ENCOUNTER — Telehealth: Payer: Self-pay | Admitting: Family Medicine

## 2019-05-29 ENCOUNTER — Encounter: Payer: Self-pay | Admitting: Family Medicine

## 2019-05-29 DIAGNOSIS — E039 Hypothyroidism, unspecified: Secondary | ICD-10-CM

## 2019-05-29 NOTE — Telephone Encounter (Signed)
Labs are notable for the following:  Thyroid TSH remains stable but low at 0.01.  Remainder of thyroid studies were normal.  Thyroid antibodies were negative.  Vitamin D is too high at 137.  All other labs look good.

## 2019-06-01 ENCOUNTER — Telehealth: Payer: Self-pay | Admitting: Family Medicine

## 2019-06-01 ENCOUNTER — Other Ambulatory Visit: Payer: Self-pay

## 2019-06-01 LAB — COMPREHENSIVE METABOLIC PANEL
AG Ratio: 1.6 (calc) (ref 1.0–2.5)
ALT: 12 U/L (ref 6–29)
AST: 16 U/L (ref 10–35)
Albumin: 4.3 g/dL (ref 3.6–5.1)
Alkaline phosphatase (APISO): 67 U/L (ref 37–153)
BUN: 23 mg/dL (ref 7–25)
CO2: 26 mmol/L (ref 20–32)
Calcium: 10.3 mg/dL (ref 8.6–10.4)
Chloride: 103 mmol/L (ref 98–110)
Creat: 0.85 mg/dL (ref 0.50–0.99)
Globulin: 2.7 g/dL (calc) (ref 1.9–3.7)
Glucose, Bld: 85 mg/dL (ref 65–99)
Potassium: 4.4 mmol/L (ref 3.5–5.3)
Sodium: 140 mmol/L (ref 135–146)
Total Bilirubin: 0.4 mg/dL (ref 0.2–1.2)
Total Protein: 7 g/dL (ref 6.1–8.1)

## 2019-06-01 LAB — CBC WITH DIFFERENTIAL/PLATELET
Absolute Monocytes: 322 cells/uL (ref 200–950)
Basophils Absolute: 42 cells/uL (ref 0–200)
Basophils Relative: 0.8 %
Eosinophils Absolute: 78 cells/uL (ref 15–500)
Eosinophils Relative: 1.5 %
HCT: 44.3 % (ref 35.0–45.0)
Hemoglobin: 14.8 g/dL (ref 11.7–15.5)
Lymphs Abs: 1565 cells/uL (ref 850–3900)
MCH: 28.4 pg (ref 27.0–33.0)
MCHC: 33.4 g/dL (ref 32.0–36.0)
MCV: 85 fL (ref 80.0–100.0)
MPV: 10.1 fL (ref 7.5–12.5)
Monocytes Relative: 6.2 %
Neutro Abs: 3193 cells/uL (ref 1500–7800)
Neutrophils Relative %: 61.4 %
Platelets: 254 10*3/uL (ref 140–400)
RBC: 5.21 10*6/uL — ABNORMAL HIGH (ref 3.80–5.10)
RDW: 12.7 % (ref 11.0–15.0)
Total Lymphocyte: 30.1 %
WBC: 5.2 10*3/uL (ref 3.8–10.8)

## 2019-06-01 LAB — THYROID PANEL WITH TSH
Free Thyroxine Index: 2.1 (ref 1.4–3.8)
T3 Uptake: 28 % (ref 22–35)
T4, Total: 7.5 ug/dL (ref 5.1–11.9)
TSH: 0.01 mIU/L — ABNORMAL LOW (ref 0.40–4.50)

## 2019-06-01 LAB — T3, FREE: T3, Free: 3.9 pg/mL (ref 2.3–4.2)

## 2019-06-01 LAB — T3, REVERSE: T3, Reverse: 18 ng/dL (ref 8–25)

## 2019-06-01 LAB — THYROID PEROXIDASE ANTIBODY: Thyroperoxidase Ab SerPl-aCnc: <1 IU/mL (ref <9)

## 2019-06-01 LAB — VITAMIN D 25 HYDROXY (VIT D DEFICIENCY, FRACTURES): Vit D, 25-Hydroxy: 137 ng/mL — ABNORMAL HIGH (ref 30–100)

## 2019-06-01 MED ORDER — THYROID 90 MG PO TABS: 90.0000 mg | ORAL_TABLET | Freq: Every day | ORAL | 1 refills | Status: DC

## 2019-06-01 MED ORDER — THYROID 90 MG PO TABS: 90.0000 mg | ORAL_TABLET | Freq: Every day | ORAL | 0 refills | Status: DC

## 2019-06-01 NOTE — Telephone Encounter (Signed)
Patient called. need a new prescription for  Armour Thyroid 90 mg. Has one day left. PLEASE call [facility], [phone].

## 2019-06-01 NOTE — Telephone Encounter (Signed)
I did add the test to the specimen we drew on the 20th - they normally hold the SSTs x 7 days, so hopefully this will get done without having to redraw.

## 2019-06-01 NOTE — Telephone Encounter (Signed)
I called the patient and advised her. She will probably run out of the Armour thyroid before it comes from [place], so she asked for a small Rx to get her through. Rx for #14 sent in to [facility] per patient request.

## 2019-06-01 NOTE — Telephone Encounter (Signed)
Rx sent 

## 2019-06-01 NOTE — Addendum Note (Signed)
Addended by: Hortencia Pilar on: 06/01/2019 12:58 PM   Modules accepted: Orders

## 2019-06-01 NOTE — Telephone Encounter (Signed)
Opened in error

## 2019-06-01 NOTE — Telephone Encounter (Signed)
This was sent in electronically to [facility] earlier, but I called it in and left it on the provider voice mail, just to make sure they get it. I told her to give them some time to get this ready and they will notify her when it is ready for pickup. I also informed her I have added the free T3 to the lab order from 05/26/19 - hopefully, the lab can do this without problem. If the specimen is not still viable, it would need to be redrawn. Will advise her if this is the case.

## 2019-06-01 NOTE — Telephone Encounter (Signed)
Pt called stating Terri told her she would call in a small rx of her thyroid medication in addition to the prescription that was [facility] but [facility] hasn't [facility] [address]. Pt would like a call back when this has been called in.   [phone]

## 2019-10-26 ENCOUNTER — Encounter: Payer: Self-pay | Admitting: Family Medicine

## 2019-11-10 ENCOUNTER — Other Ambulatory Visit: Payer: Self-pay | Admitting: Family Medicine

## 2019-11-10 MED ORDER — THYROID 90 MG PO TABS: 90.0000 mg | ORAL_TABLET | Freq: Every day | ORAL | 0 refills | Status: DC

## 2019-11-10 NOTE — Addendum Note (Signed)
Addended by: Hortencia Pilar on: 11/10/2019 04:43 PM   Modules accepted: Orders

## 2019-11-10 NOTE — Addendum Note (Signed)
Addended by: Hortencia Pilar on: 11/10/2019 04:58 PM   Modules accepted: Orders

## 2019-11-30 ENCOUNTER — Other Ambulatory Visit: Payer: Self-pay | Admitting: Obstetrics and Gynecology

## 2019-11-30 DIAGNOSIS — N951 Menopausal and female climacteric states: Secondary | ICD-10-CM

## 2019-11-30 DIAGNOSIS — Z79890 Hormone replacement therapy (postmenopausal): Secondary | ICD-10-CM

## 2019-12-01 ENCOUNTER — Other Ambulatory Visit: Payer: Self-pay

## 2019-12-01 DIAGNOSIS — Z79890 Hormone replacement therapy (postmenopausal): Secondary | ICD-10-CM

## 2019-12-01 DIAGNOSIS — N951 Menopausal and female climacteric states: Secondary | ICD-10-CM

## 2019-12-01 MED ORDER — ESTRADIOL 0.5 MG PO TABS: 0.5000 mg | ORAL_TABLET | Freq: Every day | ORAL | 0 refills | Status: DC

## 2019-12-01 MED ORDER — PROGESTERONE MICRONIZED 100 MG PO CAPS: 100.0000 mg | ORAL_CAPSULE | Freq: Every day | ORAL | 0 refills | Status: DC

## 2019-12-01 NOTE — Telephone Encounter (Signed)
Pt calling; has made appt for Jan 6th; has 2wks of meds left.  [phone]  Called pt to see which pharm she needs refills sent to; she adv [facility]; I adv I was sending it in now.

## 2020-02-10 NOTE — Progress Notes (Signed)
PCP: Lavada Mesi, MD   Chief Complaint  Patient presents with   Annual Exam    HPI:      [name] is a 70 y.o. [phone] who LMP was No LMP recorded. Patient is postmenopausal., presents today for her annual examination.  Her menses are absent due to menopause. She does not have PMB. She does have occas vasomotor sx and treats with estradiol 0.5 mg and prometrium 100 mg caps nightly with mostly relief. She was on biest but changed to estradiol. Doing well. Wants to cont. Aware of risks/benefits of staying on HRT. Not ready to wean off.  Sex activity: single partner, contraception - post menopausal status. She does have vaginal dryness, uses lubricants with relief. Having some decreased libido, asked about adding testosterone tx.  Last Pap: December 14, 2014  Results were: no abnormalities /neg HPV DNA.  Hx of STDs: none  Last mammogram: 06/20/17  Results were: normal--routine follow-up in 12 months There is no FH of breast cancer. There is no FH of ovarian cancer. The patient does not do self-breast exams.  Colonoscopy: never;  cologuard NEG 2020, repeat due after 3 yrs.   Tobacco use: The patient denies current or previous tobacco use. Alcohol use: none  Drug use: none Exercise: min active  She does get adequate calcium and Vitamin D in her diet.  DEXA: 05/2017 at [facility]. 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 Pt on estradiol 0.5 mg. Getting calcium/Vit D. Repeat due 2021.  Labs with PCP.  She is being followed for RT hydrosalpinx noted on u/s 2018. She had neg ca-125 1/19. No pain/sx. F/u u/s done 3/19 with stable, dilated RT fallopian tube. Pt s/p TL in past. Has declined GYN u/s last couple yrs and again today.  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   History of Papanicolaou smear of cervix 12/23/2014   -/-   Hypercholesteremia    Hyperthyroidism    Hypothyroidism (acquired)    AFTER THYROID ABLATION   Osteoporosis    OF THE SPINE   Periumbilical hernia     Past Surgical History:  Procedure Laterality Date   HERNIA REPAIR  1998 & 2000   MYOMECTOMY  1978-79   FIBROID REMOVED   THYROID SURGERY     ABLATION   TUBAL LIGATION  1989    Family History  Problem Relation Age of Onset   Hypertension Mother    Neurologic Disorder Mother    Ulcerative colitis Mother    Thyroid disease Mother    Hypertension Father    Heart attack Father    Lymphoma Maternal Aunt    Thyroid disease Maternal Aunt    Diabetes Maternal Grandmother    Cancer - Cervical Paternal Grandmother    Cancer Paternal Grandmother    Heart attack Paternal Grandfather    Uterine cancer Maternal Aunt        ?   Thyroid disease Maternal Aunt    Cancer Maternal Aunt     Social History   Socioeconomic History   Marital status: Married    Spouse name: Not on file   Number  of children: Not on file   Years of education: 12   Highest education level: Not on file  Occupational History   Occupation: RETIRED  Tobacco Use   Smoking status: Never Smoker   Smokeless tobacco: Never Used  Vaping Use   Vaping Use: Never used  Substance and Sexual Activity   Alcohol use: No   Drug use: No   Sexual activity: Yes    Birth control/protection: Post-menopausal  Other Topics Concern   Not on file  Social History Narrative   Married.   3 children.   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   Enjoys bowling, traveling, reading.    Social Determinants of Health   Financial Resource Strain: Not on file  Food Insecurity: Not on file  Transportation Needs: Not on file  Physical Activity: Not on file  Stress: Not on file  Social  Connections: Not on file  Intimate Partner Violence: Not on file    Current Meds  Medication Sig   AMBULATORY NON FORMULARY MEDICATION Take 12.5 mg by mouth daily   Iodorol - Iodine/Potassium Supplement   ARMOUR THYROID 90 MG tablet TAKE 1 TABLET DAILY   Cholecalciferol (VITAMIN D3) LIQD Take 5,000 Units by mouth daily.   Methylcobalamin 1000 MCG TBDP Take 1,000 mcg by mouth daily.   vitamin E 400 UNIT capsule Take 400 Units by mouth daily.   VITAMIN K PO Take 90 mcg by mouth daily.   [DISCONTINUED] estradiol (ESTRACE) 0.5 MG tablet Take 1 tablet (0.5 mg total) by mouth daily.   [DISCONTINUED] NON FORMULARY daily.   [DISCONTINUED] progesterone (PROMETRIUM) 100 MG capsule Take 1 capsule (100 mg total) by mouth daily.      ROS:  Review of Systems  Constitutional: Positive for fatigue. Negative for fever and unexpected weight change.  Respiratory: Negative for cough, shortness of breath and wheezing.   Cardiovascular: Negative for chest pain, palpitations and leg swelling.  Gastrointestinal: Negative for blood in stool, constipation, diarrhea, nausea and vomiting.  Endocrine: Negative for cold intolerance, heat intolerance and polyuria.  Genitourinary: Negative for dyspareunia, dysuria, flank pain, frequency, genital sores, hematuria, menstrual problem, pelvic pain, urgency, vaginal bleeding, vaginal discharge and vaginal pain.  Musculoskeletal: Negative for back pain, joint swelling and myalgias.  Skin: Negative for rash.  Neurological: Negative for dizziness, syncope, light-headedness, numbness and headaches.  Hematological: Negative for adenopathy.  Psychiatric/Behavioral: Negative for agitation, confusion, sleep disturbance and suicidal ideas. The patient is not nervous/anxious.      Objective: BP 124/76    Pulse 89    Temp 98.5 F (36.9 C) (Oral)    Ht 5\' 4"  (1.626 m)    Wt 182 lb (82.6 kg)    SpO2 97%    BMI 31.24 kg/m    Physical Exam Constitutional:       Appearance: She is well-developed.  Genitourinary:     Vulva and rectum normal.     Right Labia: No rash, tenderness or lesions.    Left Labia: No tenderness, lesions or rash.    No vaginal discharge, erythema or tenderness.      Right Adnexa: not tender and no mass present.    Left Adnexa: not tender and no mass present.    No cervical motion tenderness, friability or polyp.     Uterus is not enlarged or tender.  Rectum:     Guaiac result negative.     No anal fissure.  Breasts:     Right: No mass, nipple discharge, skin change  or tenderness.     Left: No mass, nipple discharge, skin change or tenderness.    Neck:     Thyroid: No thyromegaly.  Cardiovascular:     Rate and Rhythm: Normal rate and regular rhythm.     Heart sounds: Normal heart sounds. No murmur heard.   Pulmonary:     Effort: Pulmonary effort is normal.     Breath sounds: Normal breath sounds.  Abdominal:     Palpations: Abdomen is soft.     Tenderness: There is no abdominal tenderness. There is no guarding or rebound.  Musculoskeletal:        General: Normal range of motion.     Cervical back: Normal range of motion.  Lymphadenopathy:     Cervical: No cervical adenopathy.  Neurological:     General: No focal deficit present.     Mental Status: She is alert and oriented to person, place, and time.     Cranial Nerves: No cranial nerve deficit.  Skin:    General: Skin is warm and dry.  Psychiatric:        Mood and Affect: Mood normal.        Behavior: Behavior normal.        Thought Content: Thought content normal.        Judgment: Judgment normal.  Vitals reviewed.     Assessment/Plan: Encounter for annual routine gynecological examination  Cervical cancer screening - Plan: Cytology - PAP  Encounter for screening mammogram for malignant neoplasm of breast - Plan: MM 3D SCREEN BREAST BILATERAL; pt to sched mammo (at [facility])  Hormone replacement therapy (HRT) - Plan: progesterone (PROMETRIUM) 100  MG capsule, estradiol (ESTRACE) 0.5 MG tablet; Rx RF. Discussed pros/cons of staying on them; pt elects to continue for now.   Vasomotor symptoms due to menopause - Plan: progesterone (PROMETRIUM) 100 MG capsule, estradiol (ESTRACE) 0.5 MG tablet  Hydrosalpinx--pt declines further eval.  Localized osteoporosis without current pathological fracture - Plan: DG Bone Density; DEXA due. Pt on estradiol, and takes ca/Vit D.   Meds ordered this encounter  Medications   progesterone (PROMETRIUM) 100 MG capsule    Sig: Take 1 capsule (100 mg total) by mouth daily.    Dispense:  90 capsule    Refill:  3    Order Specific Question:   Supervising Provider    Answer:   Gae Dry [id number]   estradiol (ESTRACE) 0.5 MG tablet    Sig: Take 1 tablet (0.5 mg total) by mouth daily.    Dispense:  90 tablet    Refill:  3    Order Specific Question:   Supervising Provider    Answer:   Gae Dry [id number]    GYN counsel breast self exam, mammography screening, menopause, osteoporosis, adequate intake of calcium and vitamin D, diet and exercise    F/U  Return in about 1 year (around 02/10/2021).  Sekou Zuckerman B. Suhayla Chisom, PA-C 02/11/2020 10:41 AM

## 2020-02-11 ENCOUNTER — Encounter: Payer: Self-pay | Admitting: Obstetrics and Gynecology

## 2020-02-11 ENCOUNTER — Other Ambulatory Visit (HOSPITAL_COMMUNITY)
Admission: RE | Admit: 2020-02-11 | Discharge: 2020-02-11 | Disposition: A | Discharge status: Home / Self Care | Payer: Medicare HMO | Source: Ambulatory Visit | Attending: Obstetrics and Gynecology | Admitting: Obstetrics and Gynecology

## 2020-02-11 ENCOUNTER — Other Ambulatory Visit: Payer: Self-pay

## 2020-02-11 ENCOUNTER — Ambulatory Visit (INDEPENDENT_AMBULATORY_CARE_PROVIDER_SITE_OTHER): Payer: Medicare HMO | Admitting: Obstetrics and Gynecology

## 2020-02-11 VITALS — BP 124/76 | HR 89 | Temp 98.5°F | Ht 64.0 in | Wt 182.0 lb

## 2020-02-11 DIAGNOSIS — Z01419 Encounter for gynecological examination (general) (routine) without abnormal findings: Secondary | ICD-10-CM | POA: Diagnosis not present

## 2020-02-11 DIAGNOSIS — N951 Menopausal and female climacteric states: Secondary | ICD-10-CM

## 2020-02-11 DIAGNOSIS — Z79890 Hormone replacement therapy (postmenopausal): Secondary | ICD-10-CM

## 2020-02-11 DIAGNOSIS — Z1231 Encounter for screening mammogram for malignant neoplasm of breast: Secondary | ICD-10-CM | POA: Diagnosis not present

## 2020-02-11 DIAGNOSIS — M816 Localized osteoporosis [Lequesne]: Secondary | ICD-10-CM

## 2020-02-11 DIAGNOSIS — Z13820 Encounter for screening for osteoporosis: Secondary | ICD-10-CM

## 2020-02-11 DIAGNOSIS — Z124 Encounter for screening for malignant neoplasm of cervix: Secondary | ICD-10-CM

## 2020-02-11 DIAGNOSIS — N7011 Chronic salpingitis: Secondary | ICD-10-CM

## 2020-02-11 MED ORDER — ESTRADIOL 0.5 MG PO TABS: 0.5000 mg | ORAL_TABLET | Freq: Every day | ORAL | 3 refills | Status: DC

## 2020-02-11 MED ORDER — PROGESTERONE MICRONIZED 100 MG PO CAPS: 100.0000 mg | ORAL_CAPSULE | Freq: Every day | ORAL | 3 refills | Status: DC

## 2020-02-11 NOTE — Patient Instructions (Addendum)
I value your feedback and you entrusting us with your care. If you get a [facility] patient survey, I would appreciate you taking the time to let us know about your experience today. Thank you!  [facility]: [phone]  [facility]: [phone]    

## 2020-02-15 LAB — CYTOLOGY - PAP: Diagnosis: NEGATIVE

## 2020-02-29 ENCOUNTER — Encounter: Payer: Self-pay | Admitting: Family Medicine

## 2020-02-29 MED ORDER — AZITHROMYCIN 250 MG PO TABS: ORAL_TABLET | ORAL | 0 refills | Status: DC

## 2020-02-29 NOTE — Addendum Note (Signed)
Addended by: Hortencia Pilar on: 02/29/2020 02:59 PM   Modules accepted: Orders

## 2020-06-23 ENCOUNTER — Encounter: Payer: Self-pay | Admitting: Obstetrics and Gynecology

## 2020-06-24 ENCOUNTER — Other Ambulatory Visit: Payer: Self-pay | Admitting: Obstetrics and Gynecology

## 2020-06-24 MED ORDER — ESTRADIOL 0.1 MG/GM VA CREA: TOPICAL_CREAM | VAGINAL | 0 refills | Status: DC

## 2020-06-24 NOTE — Progress Notes (Addendum)
Rx estrace for vaginal dryness. Not on formulary.  Rx changed to prem vag crm 06/27/20.

## 2020-06-27 ENCOUNTER — Ambulatory Visit
Admission: RE | Admit: 2020-06-27 | Discharge: 2020-06-27 | Disposition: A | Discharge status: Home / Self Care | Payer: Medicare HMO | Source: Ambulatory Visit | Attending: Obstetrics and Gynecology | Admitting: Obstetrics and Gynecology

## 2020-06-27 ENCOUNTER — Other Ambulatory Visit: Payer: Self-pay

## 2020-06-27 ENCOUNTER — Other Ambulatory Visit: Payer: Self-pay | Admitting: Obstetrics and Gynecology

## 2020-06-27 DIAGNOSIS — Z1231 Encounter for screening mammogram for malignant neoplasm of breast: Secondary | ICD-10-CM | POA: Diagnosis not present

## 2020-06-27 MED ORDER — PREMARIN 0.625 MG/GM VA CREA: TOPICAL_CREAM | VAGINAL | 0 refills | Status: DC

## 2020-06-27 MED ORDER — ESTRADIOL 0.1 MG/GM VA CREA: TOPICAL_CREAM | VAGINAL | 0 refills | Status: DC

## 2020-06-27 NOTE — Addendum Note (Signed)
Addended by: Ardeth Perfect B on: 0/17/4944 08:57 AM   Modules accepted: Orders

## 2020-06-27 NOTE — Progress Notes (Signed)
[facility]. [facility]in to [facility]. Pt uses mail order and doesn't want premarin.

## 2020-06-29 ENCOUNTER — Other Ambulatory Visit: Payer: Self-pay | Admitting: *Deleted

## 2020-06-29 ENCOUNTER — Inpatient Hospital Stay
Admission: RE | Admit: 2020-06-29 | Discharge: 2020-06-29 | Disposition: A | Discharge status: Home / Self Care | Payer: Self-pay | Source: Ambulatory Visit | Attending: *Deleted | Admitting: *Deleted

## 2020-06-29 DIAGNOSIS — Z1231 Encounter for screening mammogram for malignant neoplasm of breast: Secondary | ICD-10-CM

## 2020-10-31 ENCOUNTER — Other Ambulatory Visit: Payer: Self-pay | Admitting: Obstetrics and Gynecology

## 2020-11-01 MED ORDER — ESTRADIOL 0.1 MG/GM VA CREA: TOPICAL_CREAM | VAGINAL | 0 refills | Status: DC

## 2021-01-19 ENCOUNTER — Telehealth: Payer: Self-pay | Admitting: Obstetrics and Gynecology

## 2021-01-19 DIAGNOSIS — Z79890 Hormone replacement therapy (postmenopausal): Secondary | ICD-10-CM

## 2021-01-19 DIAGNOSIS — N951 Menopausal and female climacteric states: Secondary | ICD-10-CM

## 2021-01-23 NOTE — Telephone Encounter (Signed)
Called and left voicemail for patient to call back to be scheduled. 

## 2021-02-03 NOTE — Telephone Encounter (Signed)
Please advise on medication refills.  

## 2021-02-07 MED ORDER — ESTRADIOL 0.1 MG/GM VA CREA: TOPICAL_CREAM | VAGINAL | 0 refills | Status: DC

## 2021-02-07 MED ORDER — PROGESTERONE MICRONIZED 100 MG PO CAPS: 100.0000 mg | ORAL_CAPSULE | Freq: Every day | ORAL | 0 refills | Status: DC

## 2021-02-07 MED ORDER — ESTRADIOL 0.5 MG PO TABS: 0.5000 mg | ORAL_TABLET | Freq: Every day | ORAL | 0 refills | Status: DC

## 2021-02-07 NOTE — Telephone Encounter (Signed)
Rx RF eRxd.  

## 2021-02-07 NOTE — Addendum Note (Signed)
Addended by: Ardeth Perfect B on: 03/08/6242 08:32 AM   Modules accepted: Orders

## 2021-02-22 ENCOUNTER — Ambulatory Visit: Payer: Medicare HMO | Admitting: Obstetrics and Gynecology

## 2021-02-22 NOTE — Progress Notes (Signed)
PCP: Eunice Blase, MD   Chief Complaint  Patient presents with   Gynecologic Exam    No concerns    HPI:      Kathryn Fields is a 71 y.o. [phone] who LMP was No LMP recorded. Patient is postmenopausal., presents today for her annual examination.  Her menses are absent due to menopause. She does not have PMB. She does have occas vasomotor sx and treats with estradiol 0.5 mg and prometrium 100 mg caps nightly with sx relief. Wants to cont. Aware of risks/benefits of staying on HRT. Not ready to wean off. Likes treatment for osteoporosis as well.  Sex activity: single partner, contraception - post menopausal status. She does have vaginal dryness, uses estrace vag crm with sx relief. [facility] RF.  Last Pap: 02/11/20 Results were: no abnormalities /neg HPV DNA 2018.  Hx of STDs: none  Last mammogram: 06/27/20 Results were: normal--routine follow-up in 12 months There is no FH of breast cancer. There is no FH of ovarian cancer. The patient does not do self-breast exams.  Colonoscopy: never;  cologuard NEG 10/2018, repeat due after 3 yrs. Pt declines colonoscopy and wants repeat cologuard.  Tobacco use: The patient denies current or previous tobacco use. Alcohol use: none  Drug use: none Exercise: min active  She does get adequate calcium and Vitamin D in her diet.  DEXA: 05/2017 at [facility]. 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 Pt on estradiol 0.5 mg. Getting calcium/Vit D. Repeat due 2021. Didn't do, will do this yr  Labs with PCP.  She is being followed for RT hydrosalpinx noted on u/s 2018. She had neg ca-125 1/19. No pain/sx. F/u u/s done 3/19 with stable, dilated RT fallopian tube. Pt s/p TL in past. Has declined GYN u/s last couple yrs and again today.  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   History of Papanicolaou smear of cervix 12/23/2014   -/-   Hypercholesteremia    Hyperthyroidism    Hypothyroidism (acquired)    AFTER THYROID ABLATION   Osteoporosis    OF THE SPINE   Periumbilical hernia     Past Surgical History:  Procedure Laterality Date   HERNIA REPAIR  1998 & 2000   MYOMECTOMY  1978-79   FIBROID REMOVED   THYROID SURGERY     ABLATION   TUBAL LIGATION  1989    Family History  Problem Relation Age of Onset   Hypertension Mother    Neurologic Disorder Mother    Ulcerative colitis Mother    Thyroid disease Mother    Hypertension Father    Heart attack Father    Lymphoma Maternal Aunt    Thyroid disease Maternal Aunt    Diabetes Maternal Grandmother    Cancer - Cervical Paternal Grandmother    Cancer Paternal Grandmother    Heart attack Paternal Grandfather    Uterine cancer Maternal Aunt        ?   Thyroid disease Maternal Aunt    Cancer Maternal Aunt     Social History   Socioeconomic History   Marital status: Married    Spouse  name: Not on file   Number of children: Not on file   Years of education: 12   Highest education level: Not on file  Occupational History   Occupation: RETIRED  Tobacco Use   Smoking status: Never   Smokeless tobacco: Never  Vaping Use   Vaping Use: Never used  Substance and Sexual Activity   Alcohol use: No   Drug use: No   Sexual activity: Yes    Birth control/protection: Post-menopausal  Other Topics Concern   Not on file  Social History Narrative   Married.   3 children.   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   Enjoys bowling, traveling, reading.    Social Determinants of Health   Financial Resource Strain: Not on file  Food Insecurity: Not on file  Transportation Needs: Not on file  Physical Activity: Not on file  Stress: Not on file  Social Connections: Not on file   Intimate Partner Violence: Not on file    Current Meds  Medication Sig   AMBULATORY NON FORMULARY MEDICATION Take 12.5 mg by mouth daily   Iodorol - Iodine/Potassium Supplement   ARMOUR THYROID 90 MG tablet TAKE 1 TABLET DAILY   Cholecalciferol (VITAMIN D3) LIQD Take 5,000 Units by mouth daily.   CYTOMEL 5 MCG tablet Take by mouth daily.   Methylcobalamin 1000 MCG TBDP Take 1,000 mcg by mouth daily.   Omega-3 Fatty Acids (FISH OIL) 1000 MG CAPS    VITAMIN K PO Take 90 mcg by mouth daily.   [DISCONTINUED] estradiol (ESTRACE) 0.1 MG/GM vaginal cream INSERT 1 GM ONCE WEEKLY AS MAINTENANCE   [DISCONTINUED] estradiol (ESTRACE) 0.5 MG tablet Take 1 tablet (0.5 mg total) by mouth daily.   [DISCONTINUED] progesterone (PROMETRIUM) 100 MG capsule Take 1 capsule (100 mg total) by mouth daily.      ROS:  Review of Systems  Constitutional:  Negative for fatigue, fever and unexpected weight change.  Respiratory:  Negative for cough, shortness of breath and wheezing.   Cardiovascular:  Negative for chest pain, palpitations and leg swelling.  Gastrointestinal:  Negative for blood in stool, constipation, diarrhea, nausea and vomiting.  Endocrine: Negative for cold intolerance, heat intolerance and polyuria.  Genitourinary:  Negative for dyspareunia, dysuria, flank pain, frequency, genital sores, hematuria, menstrual problem, pelvic pain, urgency, vaginal bleeding, vaginal discharge and vaginal pain.  Musculoskeletal:  Positive for arthralgias. Negative for back pain, joint swelling and myalgias.  Skin:  Negative for rash.  Neurological:  Negative for dizziness, syncope, light-headedness, numbness and headaches.  Hematological:  Negative for adenopathy.  Psychiatric/Behavioral:  Negative for agitation, confusion, sleep disturbance and suicidal ideas. The patient is not nervous/anxious.     Objective: BP (!) 142/80    Ht 5\' 2"  (1.575 m)    Wt 183 lb (83 kg)    BMI 33.47 kg/m    Physical  Exam Constitutional:      Appearance: She is well-developed.  Genitourinary:     Vulva and rectum normal.     Right Labia: No rash, tenderness or lesions.    Left Labia: No tenderness, lesions or rash.    No vaginal discharge, erythema or tenderness.     Mild vaginal atrophy present.     Right Adnexa: not tender and no mass present.    Left Adnexa: not tender and no mass present.    No cervical motion tenderness, friability or polyp.     Uterus is not enlarged or tender.  Rectum:     Guaiac  result negative.     No anal fissure.  Breasts:    Right: No mass, nipple discharge, skin change or tenderness.     Left: No mass, nipple discharge, skin change or tenderness.  Neck:     Thyroid: No thyromegaly.  Cardiovascular:     Rate and Rhythm: Normal rate and regular rhythm.     Heart sounds: Normal heart sounds. No murmur heard. Pulmonary:     Effort: Pulmonary effort is normal.     Breath sounds: Normal breath sounds.  Abdominal:     Palpations: Abdomen is soft.     Tenderness: There is no abdominal tenderness. There is no guarding or rebound.  Musculoskeletal:        General: Normal range of motion.     Cervical back: Normal range of motion.  Lymphadenopathy:     Cervical: No cervical adenopathy.  Neurological:     General: No focal deficit present.     Mental Status: She is alert and oriented to person, place, and time.     Cranial Nerves: No cranial nerve deficit.  Skin:    General: Skin is warm and dry.  Psychiatric:        Mood and Affect: Mood normal.        Behavior: Behavior normal.        Thought Content: Thought content normal.        Judgment: Judgment normal.  Vitals reviewed.    Assessment/Plan: Encounter for annual routine gynecological examination  Encounter for screening mammogram for malignant neoplasm of breast - Plan: MM 3D SCREEN BREAST BILATERAL; pt to [facility]  Screening for colon cancer--colonoscopy/cologuard discussed. Pt elects cologuard.  Ref will be sent 09/2018 since not due till then. Will f/u with results.  Hormone replacement therapy (HRT) - Plan: progesterone (PROMETRIUM) 100 MG capsule, estradiol (ESTRACE) 0.5 MG tablet, estradiol (ESTRACE) 0.1 MG/GM vaginal cream; Rx RF to mail order  Vasomotor symptoms due to menopause - Plan: progesterone (PROMETRIUM) 100 MG capsule, estradiol (ESTRACE) 0.5 MG tablet; pt doing well. Declines weaning down. Rx RF to mail order  Vaginal dryness, menopausal - Plan: estradiol (ESTRACE) 0.1 MG/GM vaginal cream; doing well with vag ERT. Rx RF to mail order.   Dyspareunia in female - Plan: estradiol (ESTRACE) 0.1 MG/GM vaginal cream  Localized osteoporosis without current pathological fracture - Plan: DG Bone Density; DEXA at [facility]; pt will schedule. Cont ca/Vit D/ERT.  Hydrosalpinx--pt declines further eval.   Meds ordered this encounter  Medications   progesterone (PROMETRIUM) 100 MG capsule    Sig: Take 1 capsule (100 mg total) by mouth daily.    Dispense:  90 capsule    Refill:  3    Order Specific Question:   Supervising Provider    Answer:   Gae Dry [[id number]]   estradiol (ESTRACE) 0.5 MG tablet    Sig: Take 1 tablet (0.5 mg total) by mouth daily.    Dispense:  90 tablet    Refill:  3    Order Specific Question:   Supervising Provider    Answer:   Gae Dry [[id number]]   estradiol (ESTRACE) 0.1 MG/GM vaginal cream    Sig: INSERT 1 GM ONCE WEEKLY AS MAINTENANCE    Dispense:  42.5 g    Refill:  1    Order Specific Question:   Supervising Provider    Answer:   Gae Dry [[id number]]    GYN counsel breast self exam, mammography screening, menopause, osteoporosis, adequate intake  of calcium and vitamin D, diet and exercise    F/U  Return in about 1 year (around 02/23/2022).  Kathryn Rochin B. Kiley Solimine, PA-C 02/23/2021 5:12 PM

## 2021-02-23 ENCOUNTER — Encounter: Payer: Self-pay | Admitting: Obstetrics and Gynecology

## 2021-02-23 ENCOUNTER — Ambulatory Visit (INDEPENDENT_AMBULATORY_CARE_PROVIDER_SITE_OTHER): Payer: Medicare HMO | Admitting: Obstetrics and Gynecology

## 2021-02-23 ENCOUNTER — Telehealth: Payer: Self-pay | Admitting: Family Medicine

## 2021-02-23 ENCOUNTER — Other Ambulatory Visit: Payer: Self-pay

## 2021-02-23 VITALS — BP 142/80 | Ht 62.0 in | Wt 183.0 lb

## 2021-02-23 DIAGNOSIS — M816 Localized osteoporosis [Lequesne]: Secondary | ICD-10-CM

## 2021-02-23 DIAGNOSIS — N941 Unspecified dyspareunia: Secondary | ICD-10-CM

## 2021-02-23 DIAGNOSIS — Z01419 Encounter for gynecological examination (general) (routine) without abnormal findings: Secondary | ICD-10-CM | POA: Diagnosis not present

## 2021-02-23 DIAGNOSIS — Z1231 Encounter for screening mammogram for malignant neoplasm of breast: Secondary | ICD-10-CM

## 2021-02-23 DIAGNOSIS — Z1211 Encounter for screening for malignant neoplasm of colon: Secondary | ICD-10-CM | POA: Diagnosis not present

## 2021-02-23 DIAGNOSIS — N951 Menopausal and female climacteric states: Secondary | ICD-10-CM

## 2021-02-23 DIAGNOSIS — N7011 Chronic salpingitis: Secondary | ICD-10-CM

## 2021-02-23 DIAGNOSIS — Z79890 Hormone replacement therapy (postmenopausal): Secondary | ICD-10-CM

## 2021-02-23 MED ORDER — ESTRADIOL 0.1 MG/GM VA CREA: TOPICAL_CREAM | VAGINAL | 1 refills | Status: AC

## 2021-02-23 MED ORDER — PROGESTERONE MICRONIZED 100 MG PO CAPS: 100.0000 mg | ORAL_CAPSULE | Freq: Every day | ORAL | 3 refills | Status: AC

## 2021-02-23 MED ORDER — ESTRADIOL 0.5 MG PO TABS: 0.5000 mg | ORAL_TABLET | Freq: Every day | ORAL | 3 refills | Status: AC

## 2021-02-23 NOTE — Patient Instructions (Signed)
I value your feedback and you entrusting us with your care. If you get a [facility] patient survey, I would appreciate you taking the time to let us know about your experience today. Thank you!  [facility]: [phone]      

## 2021-02-23 NOTE — Telephone Encounter (Signed)
Records emailed to [facility]

## 2021-07-06 ENCOUNTER — Ambulatory Visit: Payer: Medicare HMO

## 2021-07-06 ENCOUNTER — Ambulatory Visit
Admission: RE | Admit: 2021-07-06 | Discharge: 2021-07-06 | Disposition: A | Discharge status: Home / Self Care | Payer: Medicare HMO | Source: Ambulatory Visit | Attending: Obstetrics and Gynecology | Admitting: Obstetrics and Gynecology

## 2021-07-06 DIAGNOSIS — Z1231 Encounter for screening mammogram for malignant neoplasm of breast: Secondary | ICD-10-CM | POA: Insufficient documentation

## 2021-09-21 ENCOUNTER — Other Ambulatory Visit: Payer: Self-pay | Admitting: Obstetrics and Gynecology

## 2021-09-21 ENCOUNTER — Encounter: Payer: Self-pay | Admitting: Obstetrics and Gynecology

## 2021-09-21 ENCOUNTER — Telehealth: Payer: Self-pay | Admitting: Obstetrics and Gynecology

## 2021-09-21 NOTE — Telephone Encounter (Signed)
I contacted pt confirming she still wanted the cologuard since due 9/23. She states her insurance co sent her a kit from another company and she'll do that test. Pt to f/u prn.

## 2021-11-17 ENCOUNTER — Encounter: Payer: Self-pay | Admitting: Obstetrics and Gynecology

## 2022-02-17 ENCOUNTER — Other Ambulatory Visit: Payer: Self-pay | Admitting: Obstetrics and Gynecology

## 2022-02-17 DIAGNOSIS — Z79890 Hormone replacement therapy (postmenopausal): Secondary | ICD-10-CM

## 2022-02-17 DIAGNOSIS — N941 Unspecified dyspareunia: Secondary | ICD-10-CM

## 2022-02-17 DIAGNOSIS — N951 Menopausal and female climacteric states: Secondary | ICD-10-CM

## 2023-04-18 ENCOUNTER — Other Ambulatory Visit: Payer: Self-pay | Admitting: Family Medicine

## 2023-04-18 DIAGNOSIS — Z1231 Encounter for screening mammogram for malignant neoplasm of breast: Secondary | ICD-10-CM

## 2023-04-18 DIAGNOSIS — N6310 Unspecified lump in the right breast, unspecified quadrant: Secondary | ICD-10-CM

## 2023-05-16 ENCOUNTER — Ambulatory Visit
Admission: RE | Admit: 2023-05-16 | Discharge: 2023-05-16 | Disposition: A | Discharge status: Home / Self Care | Source: Ambulatory Visit | Attending: Family Medicine | Admitting: Family Medicine

## 2023-05-16 ENCOUNTER — Other Ambulatory Visit: Payer: Self-pay | Admitting: Family Medicine

## 2023-05-16 DIAGNOSIS — N6310 Unspecified lump in the right breast, unspecified quadrant: Secondary | ICD-10-CM

## 2023-05-16 DIAGNOSIS — N6342 Unspecified lump in left breast, subareolar: Secondary | ICD-10-CM | POA: Insufficient documentation

## 2023-05-16 DIAGNOSIS — N6311 Unspecified lump in the right breast, upper outer quadrant: Secondary | ICD-10-CM | POA: Insufficient documentation

## 2023-05-16 DIAGNOSIS — Z1231 Encounter for screening mammogram for malignant neoplasm of breast: Secondary | ICD-10-CM | POA: Diagnosis present

## 2023-08-18 IMAGING — MG MM DIGITAL SCREENING BILAT W/ TOMO AND CAD
6 of 10 series · 6 of 30 positions shown · non-contrast
Comparison: Previous exam(s).

CLINICAL DATA: Screening.

EXAM:
DIGITAL SCREENING BILATERAL MAMMOGRAM WITH TOMOSYNTHESIS AND CAD
TECHNIQUE: Bilateral screening digital craniocaudal and mediolateral oblique
mammograms were obtained. Bilateral screening digital breast
tomosynthesis was performed. The images were evaluated with
computer-aided detection.

[R CC synth-2D]
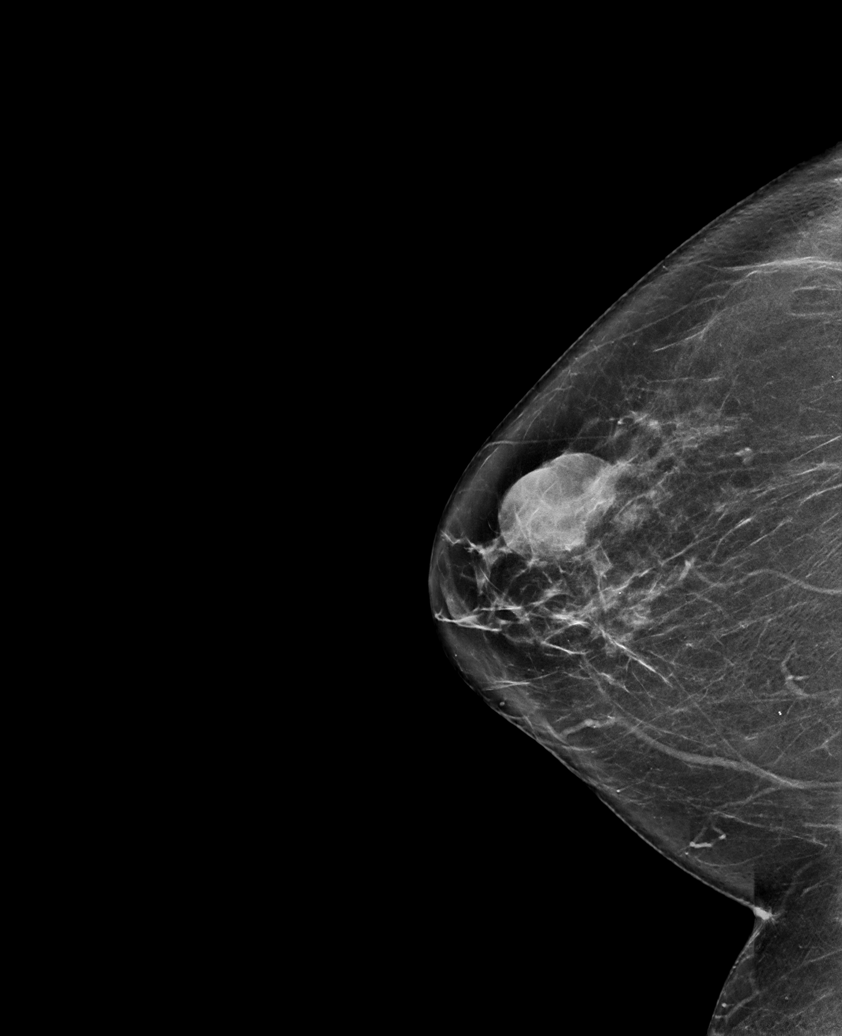

[L MLO synth-2D (1 of 2)]
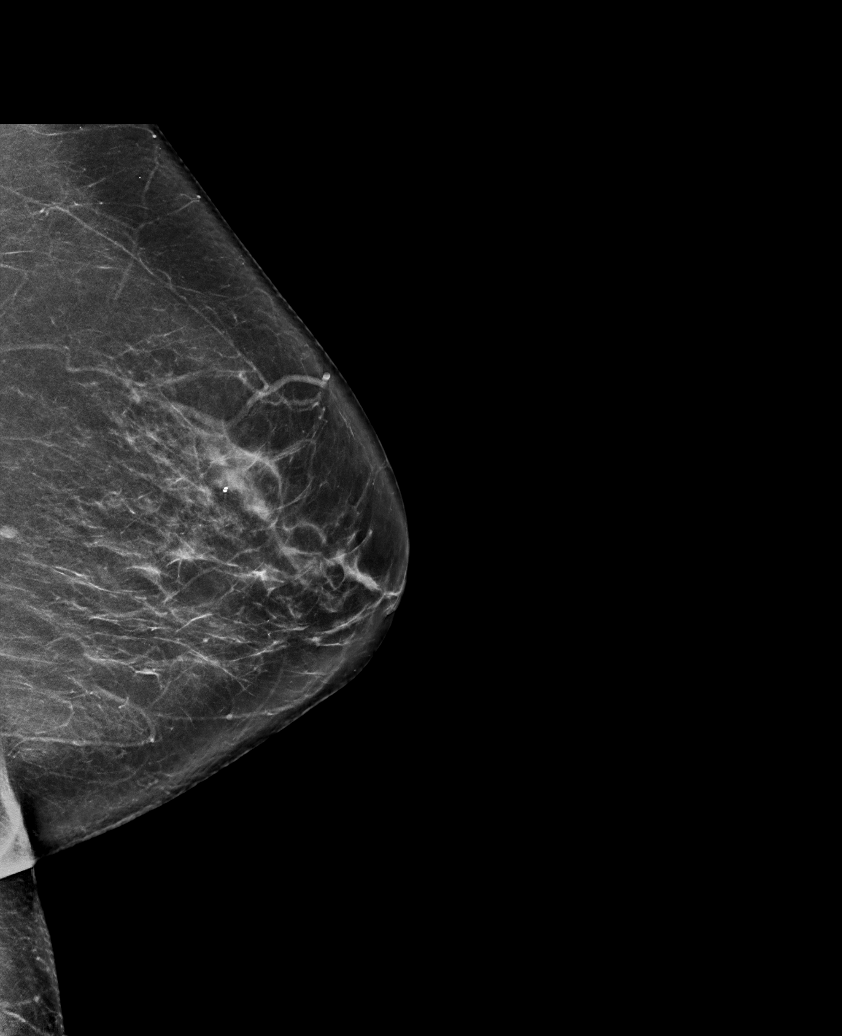

[R MLO synth-2D]
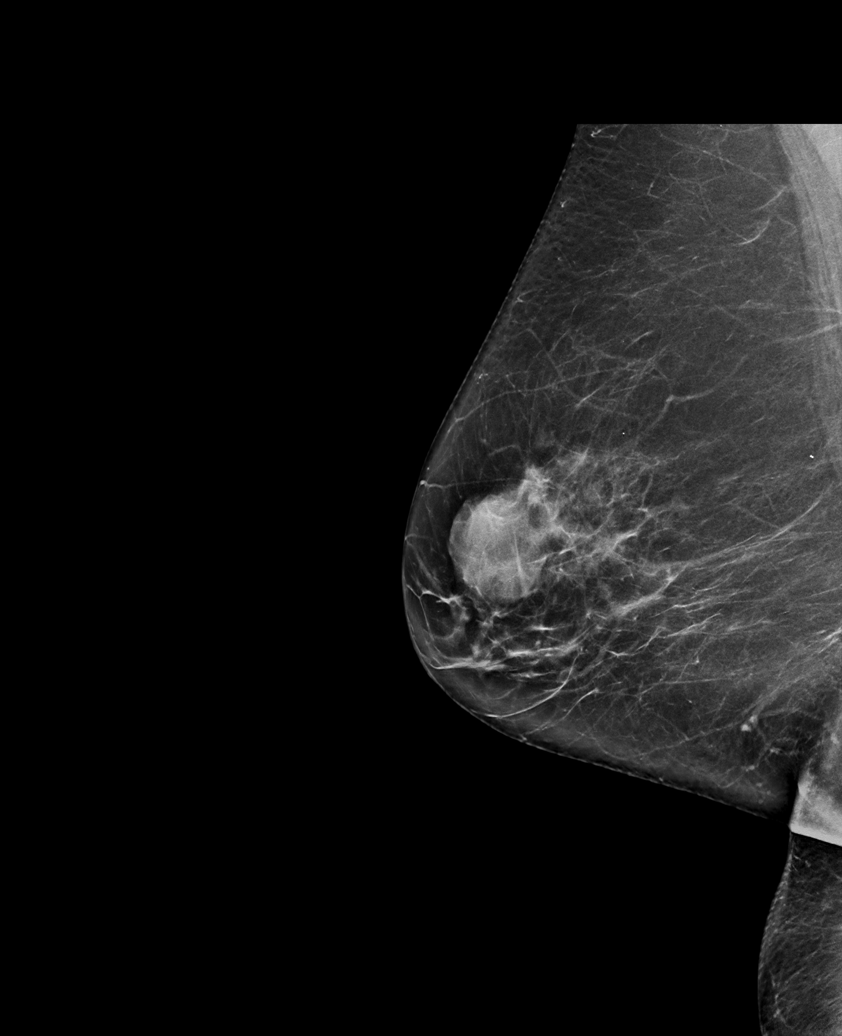

[L MLO synth-2D (2 of 2)]
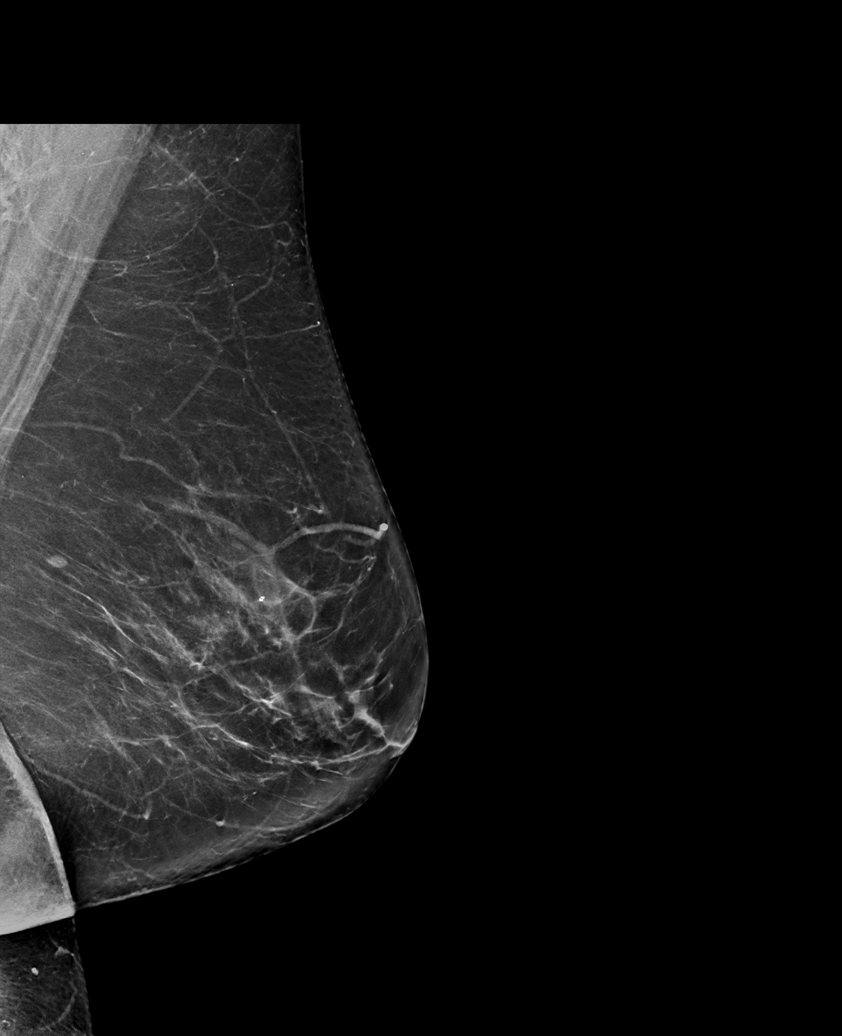

[L CC synth-2D]
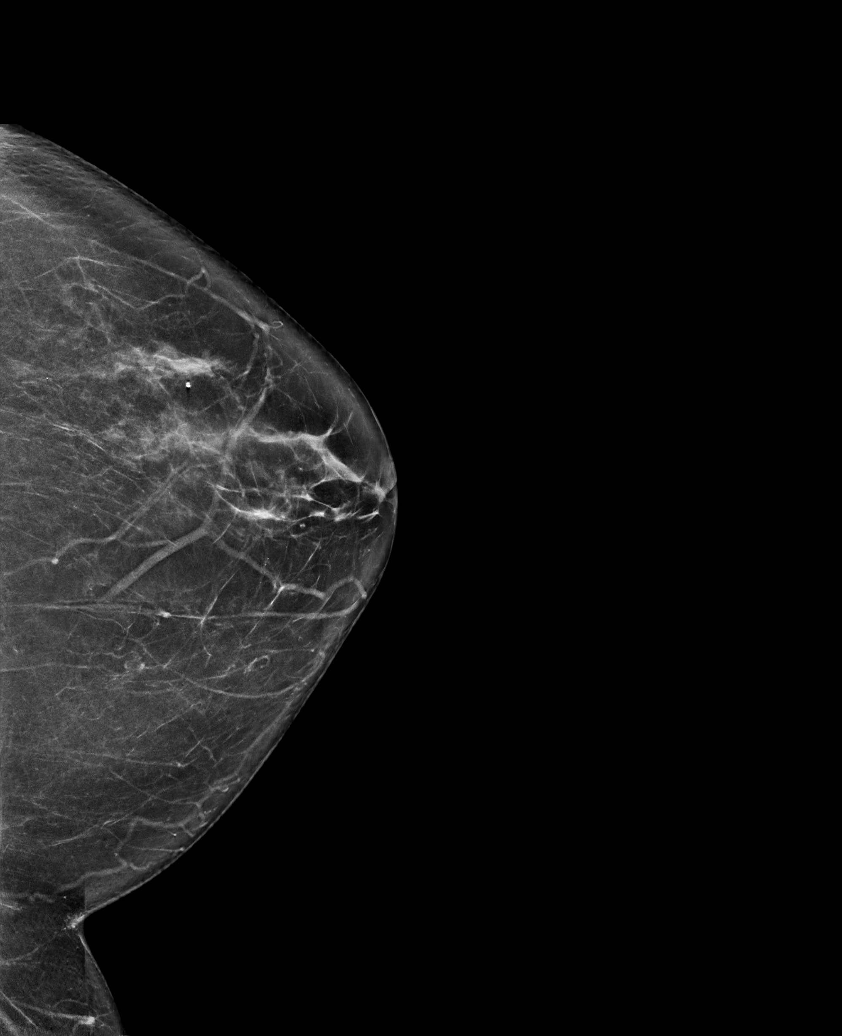

[R CC tomo · tomo slice 37/74.0]
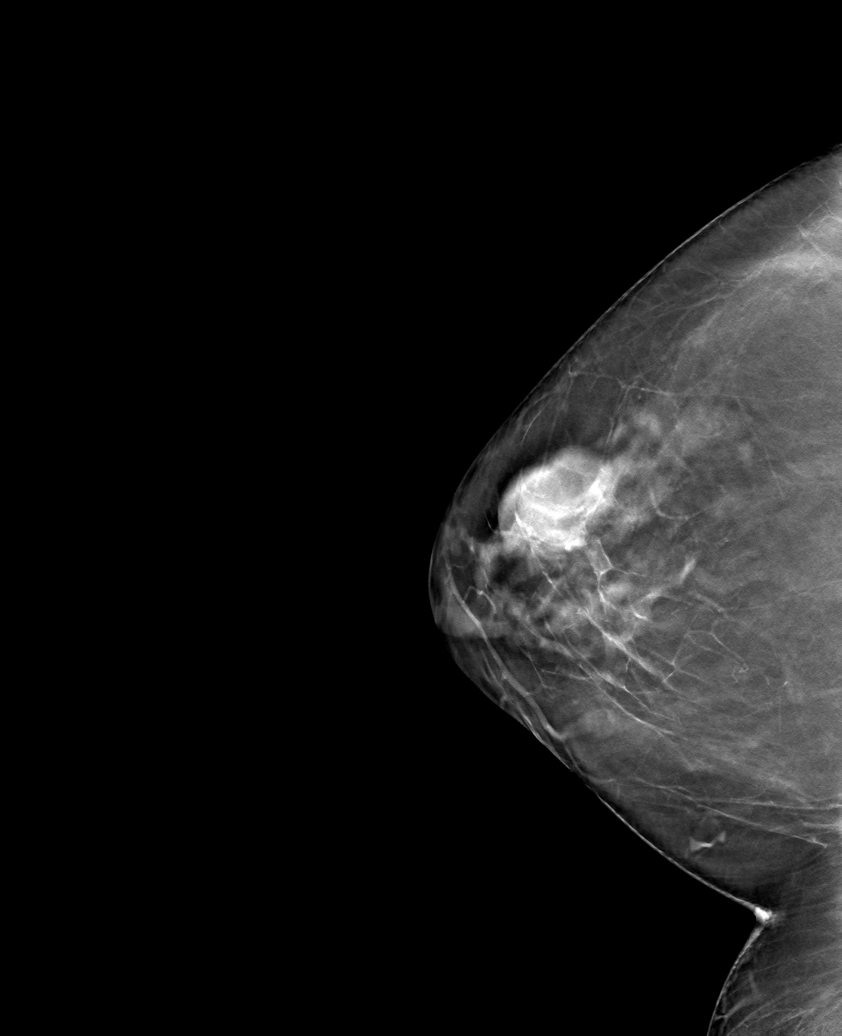

[6 of 30 positions shown; findings below may reference images not displayed]

ACR Breast Density Category b: There are scattered areas of
fibroglandular density.
FINDINGS: There are no findings suspicious for malignancy.
IMPRESSION: No mammographic evidence of malignancy. A result letter of this
screening mammogram will be mailed directly to the patient.

RECOMMENDATION:
Screening mammogram in one year. (Code:51-O-LD2)

BI-RADS CATEGORY  1: Negative.
